# Patient Record
Sex: Male | Born: 1970
Health system: Southern US, Community
[De-identification: ages and names within clinical notes are randomized; demographics above are authoritative.]

## PROBLEM LIST (undated history)

## (undated) DIAGNOSIS — I10 Essential (primary) hypertension: Secondary | ICD-10-CM

## (undated) HISTORY — DX: Essential (primary) hypertension: I10

## (undated) HISTORY — PX: OTHER SURGICAL HISTORY: SHX169

---

## 2001-06-23 ENCOUNTER — Emergency Department (HOSPITAL_COMMUNITY): Admission: EM | Admit: 2001-06-23 | Discharge: 2001-06-23 | Payer: Self-pay

## 2001-09-18 ENCOUNTER — Encounter: Admission: RE | Admit: 2001-09-18 | Discharge: 2001-09-18 | Payer: Self-pay | Admitting: Family Medicine

## 2002-04-05 ENCOUNTER — Encounter: Admission: RE | Admit: 2002-04-05 | Discharge: 2002-04-05 | Payer: Self-pay | Admitting: Sports Medicine

## 2002-09-09 ENCOUNTER — Encounter: Admission: RE | Admit: 2002-09-09 | Discharge: 2002-09-09 | Payer: Self-pay | Admitting: Family Medicine

## 2002-09-20 ENCOUNTER — Encounter: Admission: RE | Admit: 2002-09-20 | Discharge: 2002-09-20 | Payer: Self-pay | Admitting: Sports Medicine

## 2002-12-31 ENCOUNTER — Encounter: Admission: RE | Admit: 2002-12-31 | Discharge: 2002-12-31 | Payer: Self-pay | Admitting: Family Medicine

## 2003-04-15 ENCOUNTER — Encounter: Admission: RE | Admit: 2003-04-15 | Discharge: 2003-04-15 | Payer: Self-pay | Admitting: Family Medicine

## 2003-10-22 ENCOUNTER — Encounter: Admission: RE | Admit: 2003-10-22 | Discharge: 2003-10-22 | Payer: Self-pay | Admitting: Family Medicine

## 2003-10-24 ENCOUNTER — Encounter: Admission: RE | Admit: 2003-10-24 | Discharge: 2003-10-24 | Payer: Self-pay | Admitting: Sports Medicine

## 2004-03-09 ENCOUNTER — Encounter: Admission: RE | Admit: 2004-03-09 | Discharge: 2004-03-09 | Payer: Self-pay | Admitting: Family Medicine

## 2004-07-21 ENCOUNTER — Ambulatory Visit: Payer: Self-pay | Admitting: Sports Medicine

## 2004-12-28 ENCOUNTER — Ambulatory Visit: Payer: Self-pay | Admitting: Sports Medicine

## 2005-11-29 ENCOUNTER — Ambulatory Visit: Payer: Self-pay | Admitting: Sports Medicine

## 2006-04-03 ENCOUNTER — Ambulatory Visit: Payer: Self-pay | Admitting: Family Medicine

## 2006-09-07 DIAGNOSIS — I10 Essential (primary) hypertension: Secondary | ICD-10-CM

## 2007-01-21 ENCOUNTER — Emergency Department (HOSPITAL_COMMUNITY): Admission: EM | Admit: 2007-01-21 | Discharge: 2007-01-21 | Payer: Self-pay | Admitting: Emergency Medicine

## 2007-01-22 ENCOUNTER — Ambulatory Visit: Payer: Self-pay | Admitting: Family Medicine

## 2007-01-22 ENCOUNTER — Telehealth: Payer: Self-pay | Admitting: *Deleted

## 2007-01-22 ENCOUNTER — Encounter (INDEPENDENT_AMBULATORY_CARE_PROVIDER_SITE_OTHER): Payer: Self-pay | Admitting: Family Medicine

## 2007-01-23 ENCOUNTER — Telehealth: Payer: Self-pay | Admitting: *Deleted

## 2007-01-24 ENCOUNTER — Telehealth: Payer: Self-pay | Admitting: *Deleted

## 2007-01-24 ENCOUNTER — Encounter (INDEPENDENT_AMBULATORY_CARE_PROVIDER_SITE_OTHER): Payer: Self-pay | Admitting: *Deleted

## 2007-11-02 ENCOUNTER — Encounter (INDEPENDENT_AMBULATORY_CARE_PROVIDER_SITE_OTHER): Payer: Self-pay | Admitting: Family Medicine

## 2007-11-02 ENCOUNTER — Ambulatory Visit: Payer: Self-pay | Admitting: Family Medicine

## 2007-11-02 ENCOUNTER — Telehealth: Payer: Self-pay | Admitting: *Deleted

## 2007-11-02 LAB — CONVERTED CEMR LAB
BUN: 16 mg/dL (ref 6–23)
Basophils Absolute: 0 10*3/uL (ref 0.0–0.1)
Calcium: 9.7 mg/dL (ref 8.4–10.5)
Chloride: 104 meq/L (ref 96–112)
Eosinophils Absolute: 0 10*3/uL (ref 0.0–0.7)
Hemoglobin: 14.4 g/dL (ref 13.0–17.0)
Lymphs Abs: 1.9 10*3/uL (ref 0.7–4.0)
Monocytes Relative: 9 % (ref 3–12)
Neutrophils Relative %: 64 % (ref 43–77)
Platelets: 322 10*3/uL (ref 150–400)
RBC: 4.89 M/uL (ref 4.22–5.81)

## 2007-11-05 ENCOUNTER — Encounter (INDEPENDENT_AMBULATORY_CARE_PROVIDER_SITE_OTHER): Payer: Self-pay | Admitting: Family Medicine

## 2008-02-08 ENCOUNTER — Telehealth: Payer: Self-pay | Admitting: Family Medicine

## 2008-03-07 ENCOUNTER — Telehealth: Payer: Self-pay | Admitting: Family Medicine

## 2008-05-08 IMAGING — CR DG KNEE COMPLETE 4+V*L*
4 series · 4 of 4 positions shown · non-contrast
Comparison: none

CLINICAL DATA: 36-year-old with knee pain since yesterday.  No known injury.  Prior ligament surgery.  
 LEFT KNEE ? 4 VIEW:

[t knee ap left]
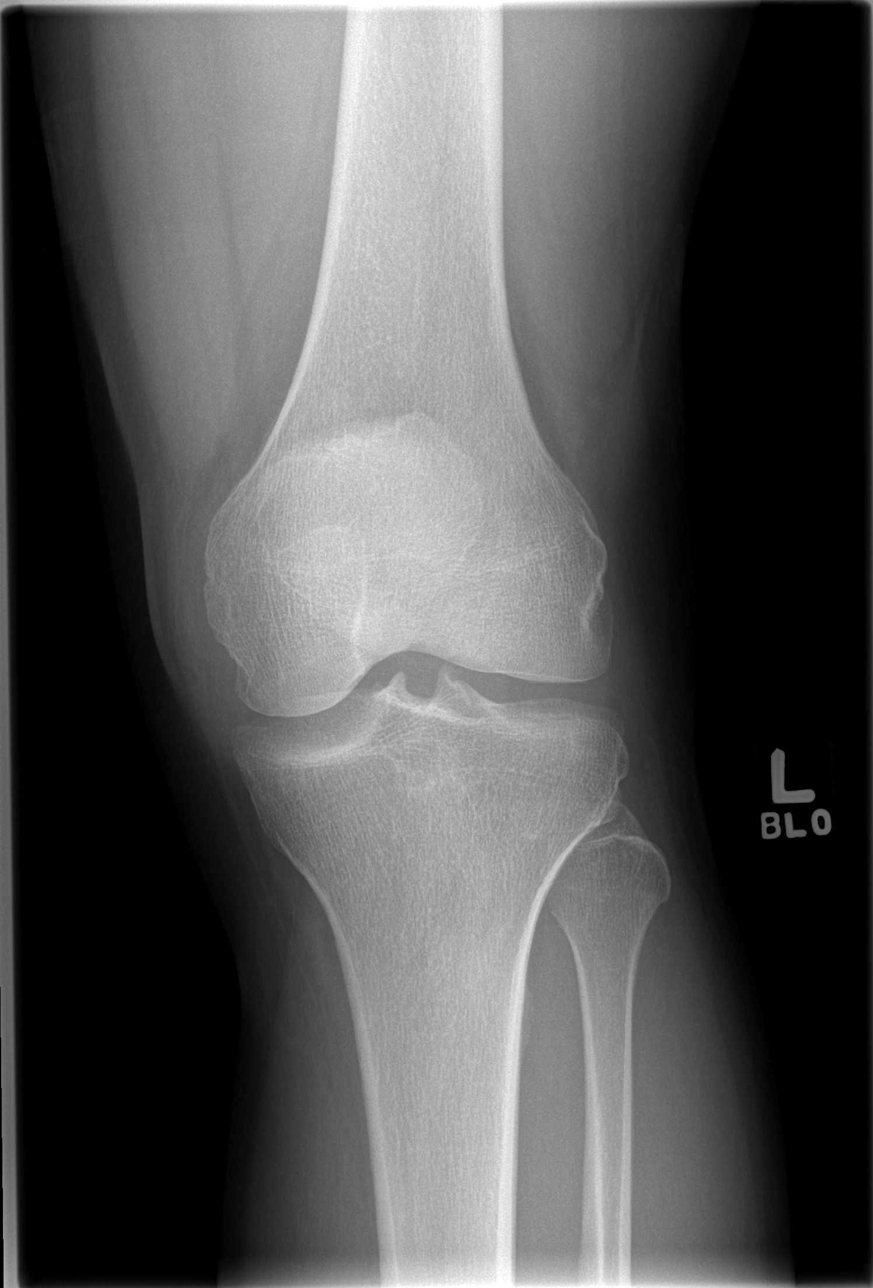

[t knee oblique left (1 of 2)]
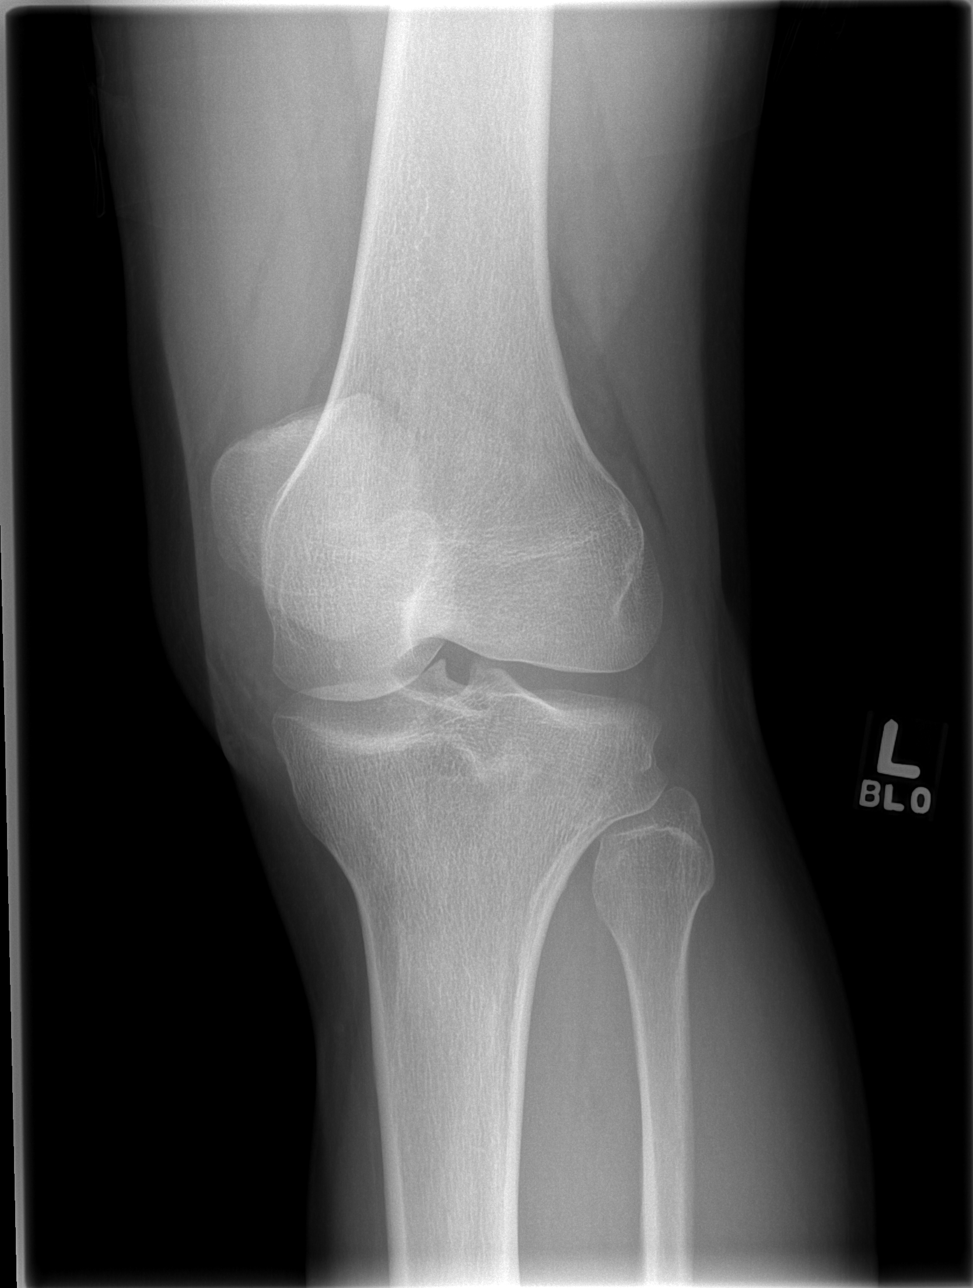

[t knee oblique left (2 of 2)]
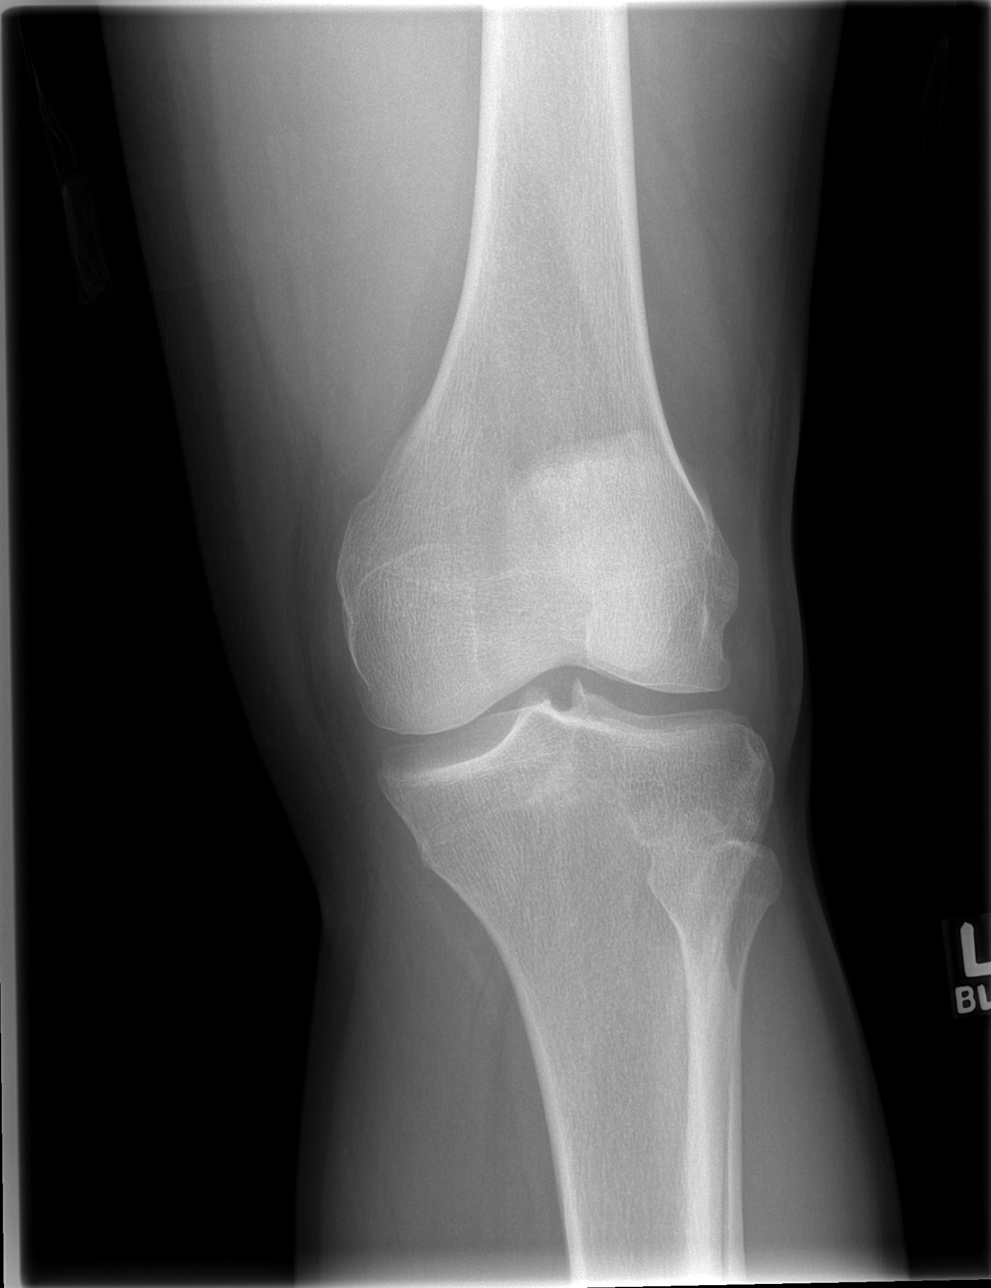

[t knee lat left]
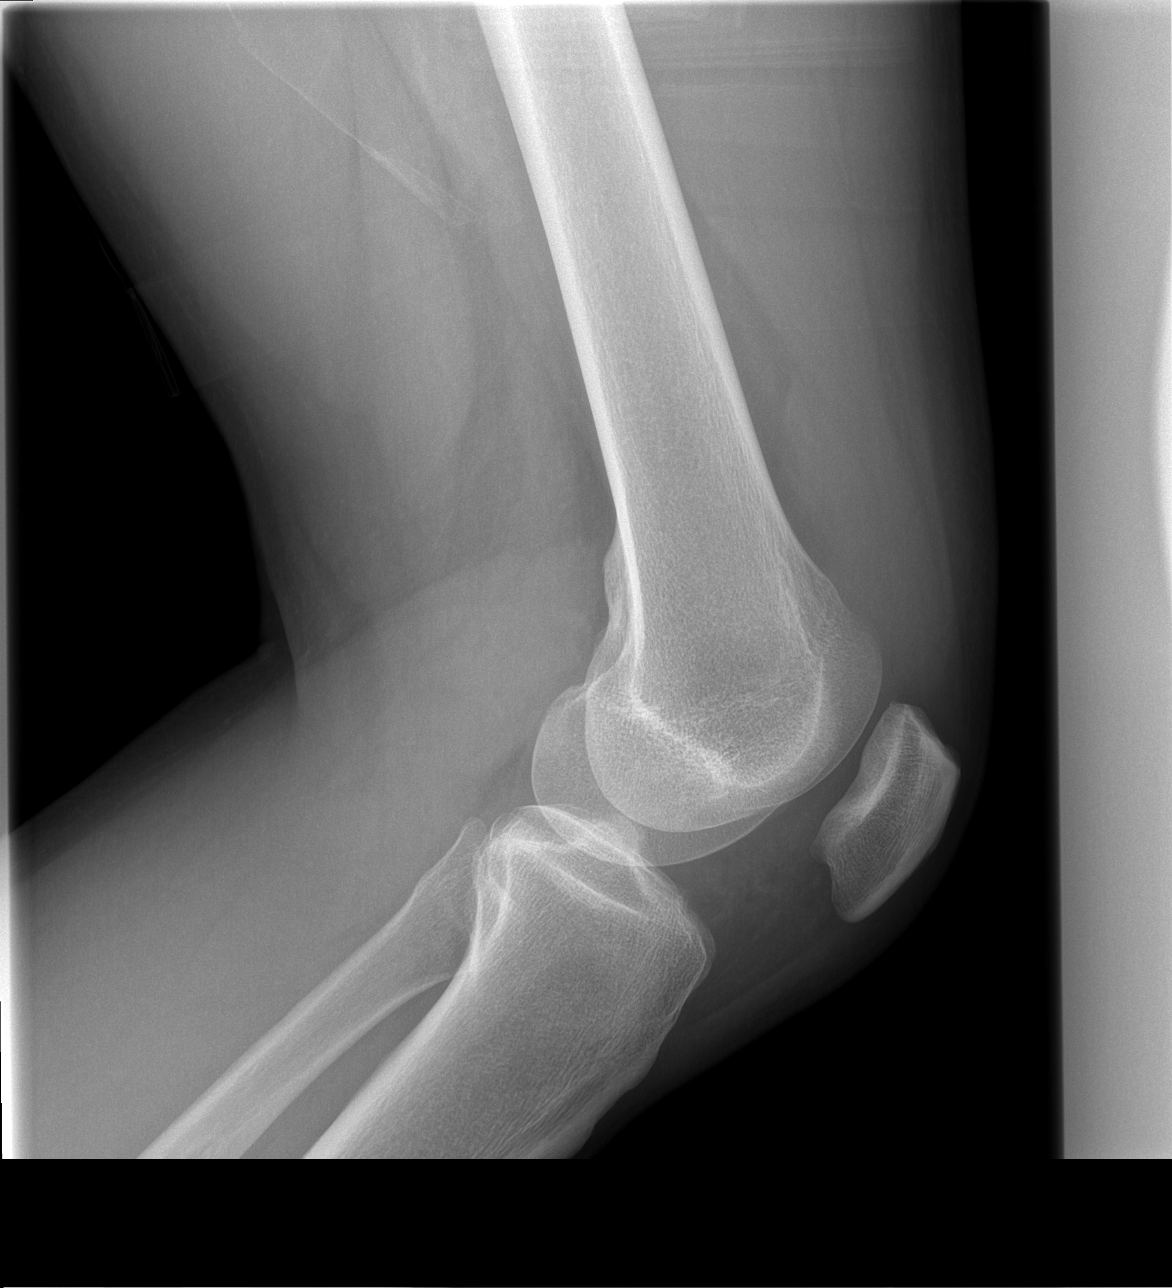

[4 of 4 positions shown; findings below may reference images not displayed]

FINDINGS: There is no evidence for acute fracture or dislocation.  Small joint effusion is suspected.  Consider MRI for further evaluation.
IMPRESSION: 1.  No fracture. 
 2.  Small effusion.

## 2008-05-21 ENCOUNTER — Telehealth: Payer: Self-pay | Admitting: *Deleted

## 2008-05-30 ENCOUNTER — Encounter: Admission: RE | Admit: 2008-05-30 | Discharge: 2008-05-30 | Payer: Self-pay | Admitting: Family Medicine

## 2008-05-30 ENCOUNTER — Ambulatory Visit: Payer: Self-pay | Admitting: Family Medicine

## 2008-05-30 LAB — CONVERTED CEMR LAB
Glucose, Urine, Semiquant: NEGATIVE
Nitrite: NEGATIVE
Specific Gravity, Urine: 1.02
Urobilinogen, UA: 0.2
WBC Urine, dipstick: NEGATIVE
pH: 7

## 2008-05-31 ENCOUNTER — Encounter: Payer: Self-pay | Admitting: Family Medicine

## 2009-04-02 ENCOUNTER — Ambulatory Visit: Payer: Self-pay | Admitting: Family Medicine

## 2009-04-02 ENCOUNTER — Encounter: Payer: Self-pay | Admitting: Family Medicine

## 2009-04-06 LAB — CONVERTED CEMR LAB
Chloride: 103 meq/L (ref 96–112)
Potassium: 4 meq/L (ref 3.5–5.3)
Sodium: 141 meq/L (ref 135–145)

## 2009-09-15 IMAGING — US US RENAL
1 series · 14 of 25 positions shown · non-contrast
Comparison: None

CLINICAL DATA: Microscopic hematuria

RENAL/URINARY TRACT ULTRASOUND
TECHNIQUE: Complete ultrasound examination of the urinary tract
was performed including evaluation of the kidneys, renal collecting
systems, and urinary bladder.

[Series 1: us renal · 0.28mm/px · 14 of 28 slices shown]
[im 1/28]
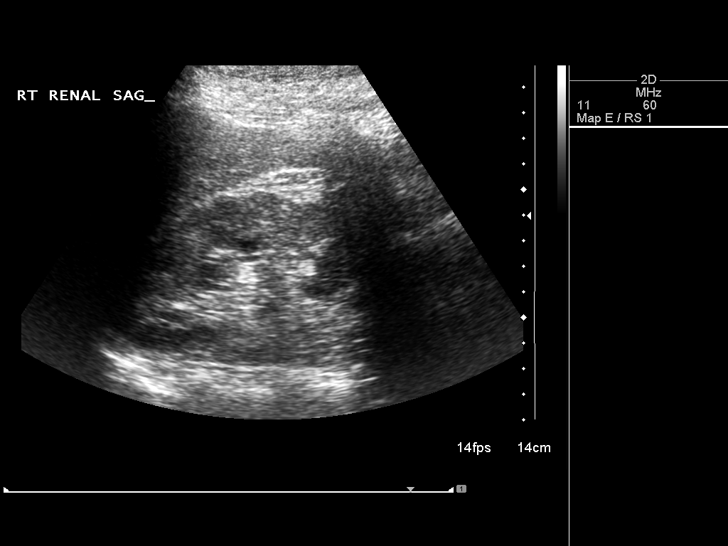
[im 3/28]
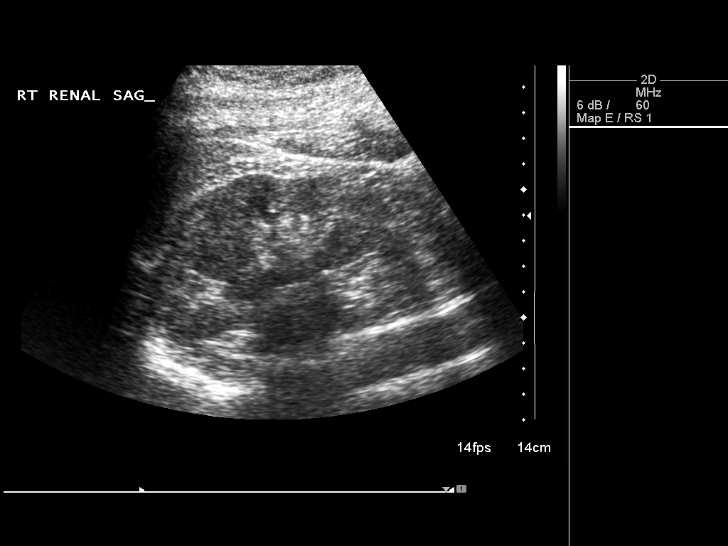
[im 5/28]
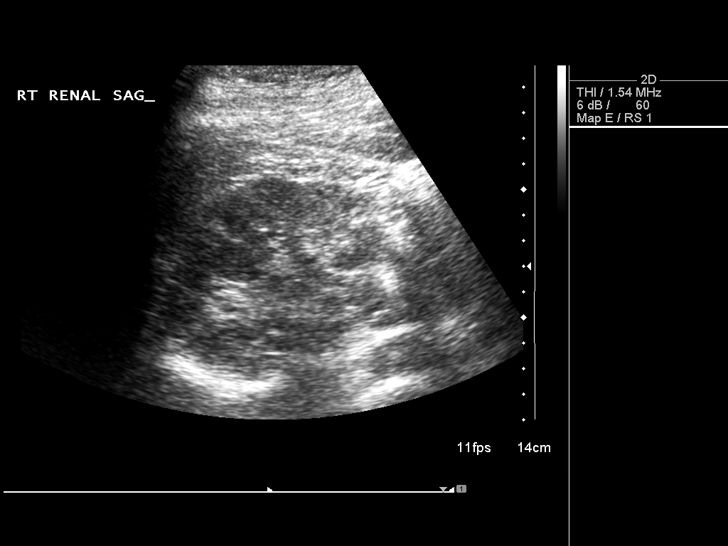
[im 7/28]
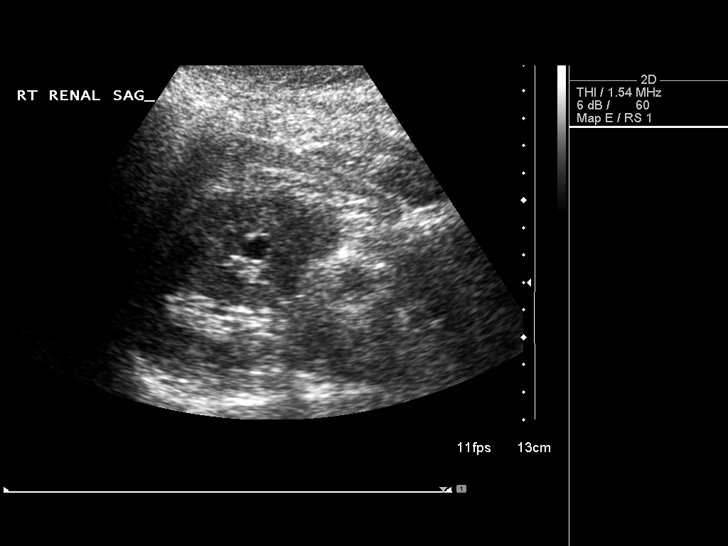
[im 10/28]
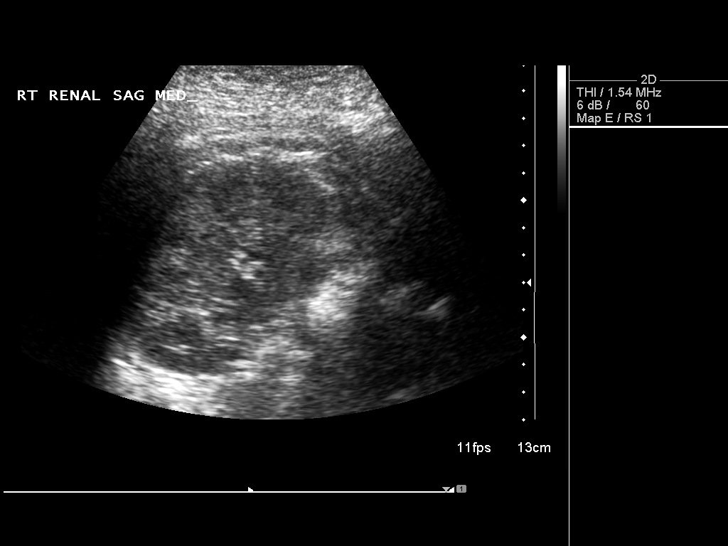
[im 11/28]
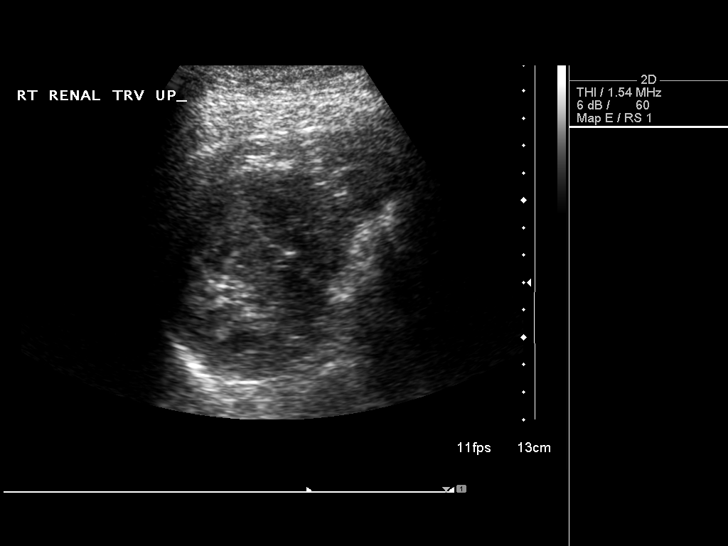
[im 13/28]
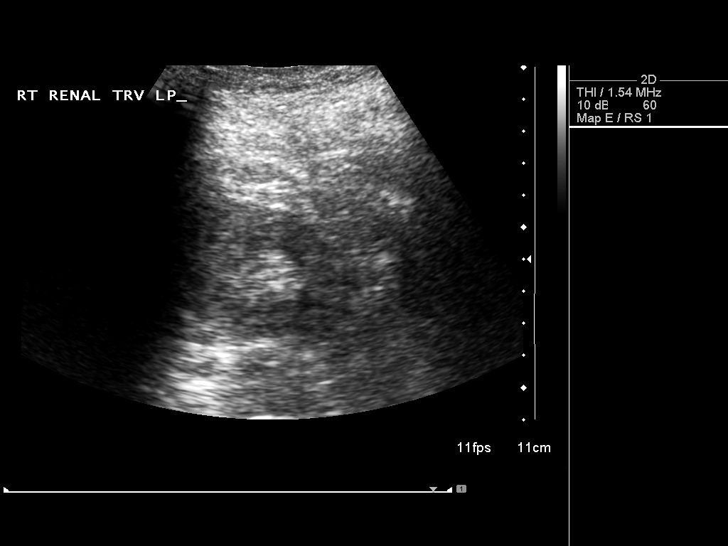
[im 15/28]
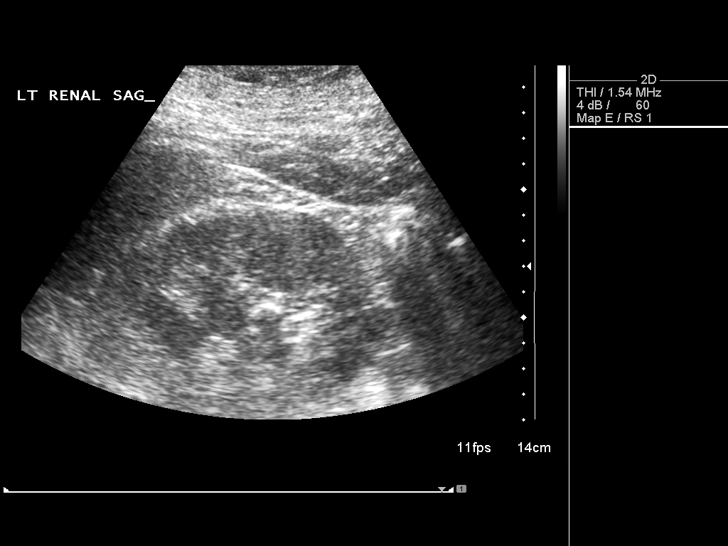
[im 17/28]
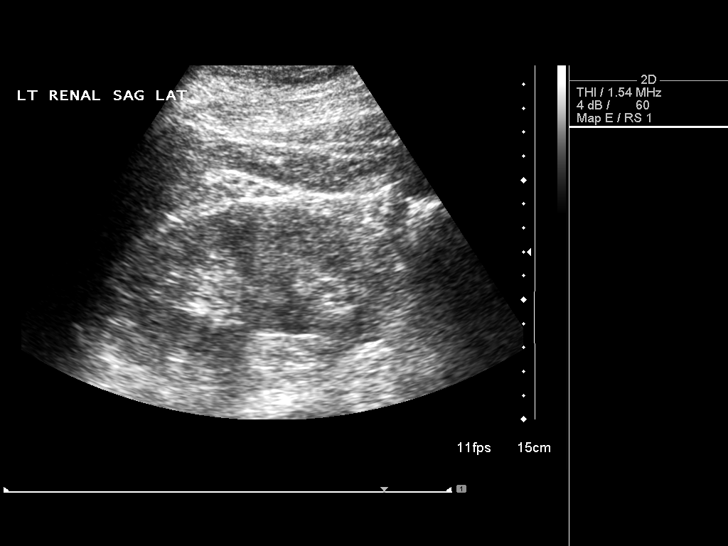
[im 19/28]
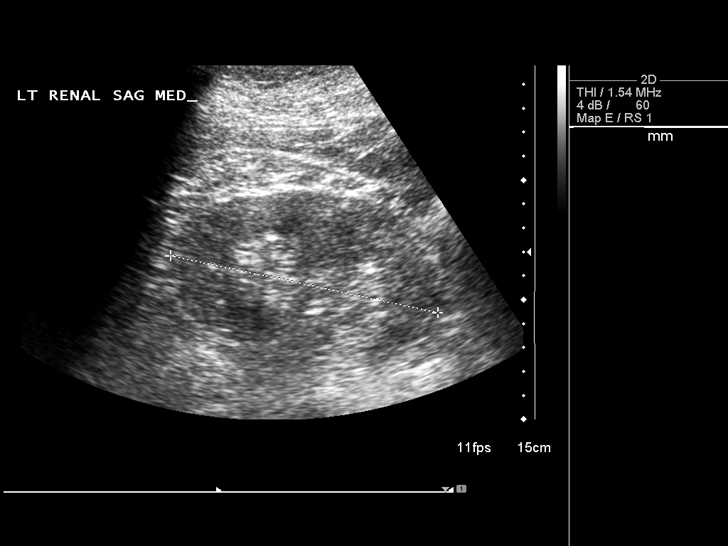
[im 21/28]
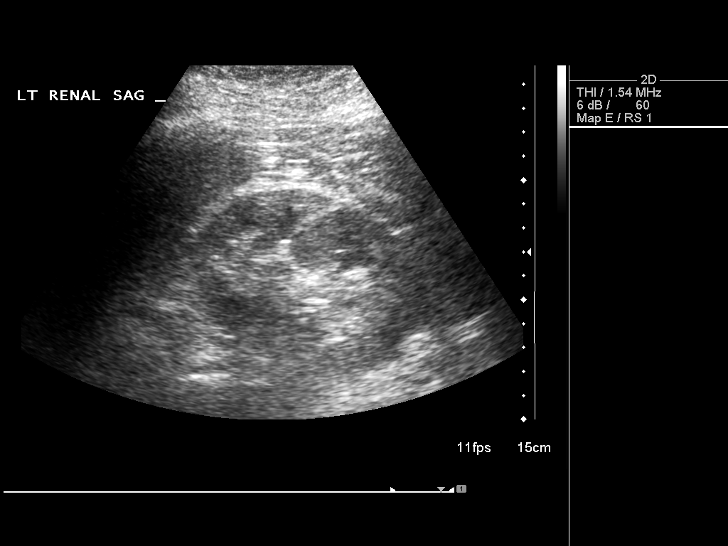
[im 23/28]
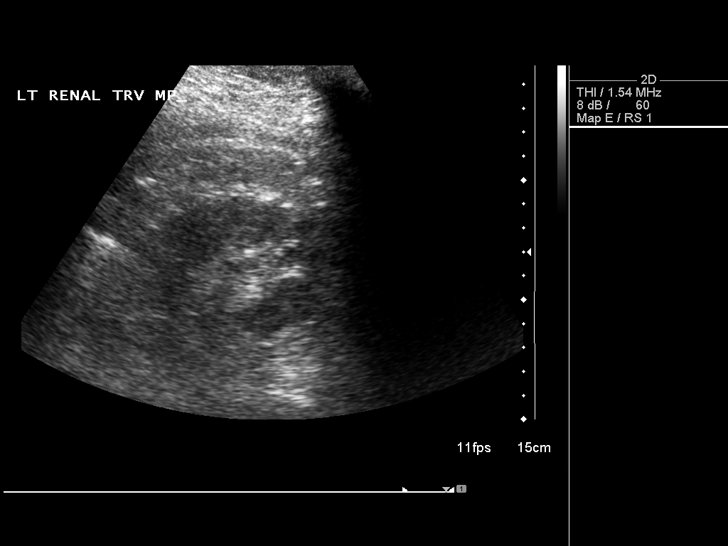
[im 25/28]
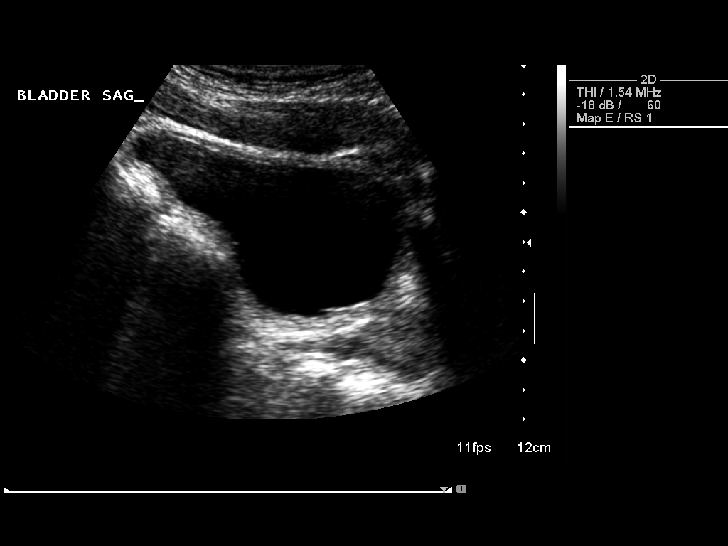
[im 28/28]
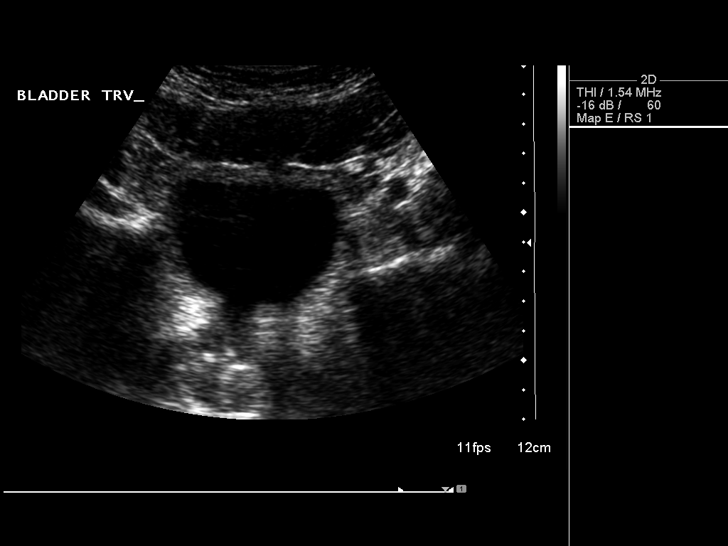

[14 of 25 positions shown; findings below may reference images not displayed]

FINDINGS: Right kidney:  10.3 cm.  Small interpolar cyst measuring 1.0 x
x 0.8 cm.  Renal cortex is mildly echogenic.  No evidence of
hydronephrosis, calculi or mass.

Left kidney:  11.4 cm.  Cortex is echogenic.  No obstruction or
renal mass.  No shadowing calculi.

Bladder:  Normal by ultrasound.
IMPRESSION: No renal masses or calculi evident by ultrasound.  Both kidneys
show some degree of increased cortical echogenicity, left greater
than right.  Correlation is suggested with renal function as this
may be indicative of chronic parenchymal disease.

## 2009-10-20 ENCOUNTER — Telehealth: Payer: Self-pay | Admitting: Family Medicine

## 2009-10-26 ENCOUNTER — Encounter: Payer: Self-pay | Admitting: Family Medicine

## 2009-11-16 ENCOUNTER — Encounter (INDEPENDENT_AMBULATORY_CARE_PROVIDER_SITE_OTHER): Payer: Self-pay | Admitting: Family Medicine

## 2009-11-17 ENCOUNTER — Encounter: Payer: Self-pay | Admitting: *Deleted

## 2009-11-18 ENCOUNTER — Encounter: Payer: Self-pay | Admitting: *Deleted

## 2009-12-02 ENCOUNTER — Ambulatory Visit: Payer: Self-pay | Admitting: Family Medicine

## 2009-12-02 LAB — CONVERTED CEMR LAB
Chloride: 103 meq/L (ref 96–112)
Glucose, Bld: 97 mg/dL (ref 70–99)
Sodium: 137 meq/L (ref 135–145)

## 2009-12-03 ENCOUNTER — Encounter: Payer: Self-pay | Admitting: Family Medicine

## 2009-12-09 ENCOUNTER — Telehealth: Payer: Self-pay | Admitting: *Deleted

## 2010-02-09 ENCOUNTER — Encounter: Payer: Self-pay | Admitting: Family Medicine

## 2010-04-09 ENCOUNTER — Telehealth: Payer: Self-pay | Admitting: Family Medicine

## 2010-08-12 NOTE — Progress Notes (Signed)
Summary: refill   Phone Note Refill Request Call back at Home Phone [phone] Message from:  Patient  Refills Requested: Medication #1:  AMLODIPINE BESYLATE 5 MG  TABS 1 by mouth once daily   Supply Requested: 3 months  Medication #2:  HYZAAR 50-12.5 MG  TABS 1 by mouth once daily.   Supply Requested: 3 months Initial call taken by: De Nurse,  April 09, 2010 9:57 AM    Prescriptions: Mauri Reading 50-12.5 MG  TABS (LOSARTAN POTASSIUM-HCTZ) 1 by mouth once daily  #90 x 3   Entered and Authorized by:   Denny Levy MD   Signed by:   Denny Levy MD on 04/09/2010   Method used:   [facility]        [facility]  [address] [phone]* (retail)       [address]       [place], [place]  [id number]       Ph: [phone] or [phone]       Fax: [phone]   RxID:   [id number] AMLODIPINE BESYLATE 5 MG  TABS (AMLODIPINE BESYLATE) 1 by mouth once daily  #90 x 3   Entered and Authorized by:   Denny Levy MD   Signed by:   Denny Levy MD on 04/09/2010   Method used:   [facility]ically to        [facility]  [address] [phone]* (retail)       [address]       [place], [place]  [id number]       Ph: [phone] or [phone]       Fax: [phone]   RxID:   [id number]

## 2010-08-12 NOTE — Miscellaneous (Signed)
Summary: refill request   Clinical Lists Changes rec'd refill request. has not been here since 9/10. I had called him & made him an appt. He did not keep that appt. to pcp.Golden Circle RN  Nov 16, 2009 8:51 AM  triage he needs to come for appt Thanks!  Denny Levy MD  Nov 16, 2009 4:46 PM  called & LM to call back & make appt for today.Golden Circle RN  Nov 17, 2009 9:26 AM  needs Hyzaar filled until appt on 5/25 - [facility] for 90 day supply (for insuance purposes) has appt w/ Jennette Kettle on 5/25 - pls call to let pt know when done - [phone] De Nurse  Nov 17, 2009 9:39 AM  to pcp.Golden Circle RN  Nov 17, 2009 9:58 AM  Medications: Rx of HYZAAR 50-12.5 MG  TABS (LOSARTAN POTASSIUM-HCTZ) 1 by mouth once daily;  #90 x 3;  Signed;  Entered by: Denny Levy MD;  Authorized by: Denny Levy MD;  Method used: Print then Give to Patient Rx of HYZAAR 50-12.5 MG  TABS (LOSARTAN POTASSIUM-HCTZ) 1 by mouth once daily;  #30 x 1;  Signed;  Entered by: Denny Levy MD;  Authorized by: Denny Levy MD;  Method used: Print then Give to Patient    DEAR Paul Henderson TEAM I am putting rx for one month supply on your desk Thanks!  Denny Levy MD  Nov 17, 2009 2:29 PM  Prescriptions: HYZAAR 50-12.5 MG  TABS (LOSARTAN POTASSIUM-HCTZ) 1 by mouth once daily  #30 x 1   Entered and Authorized by:   Denny Levy MD   Signed by:   Denny Levy MD on 11/17/2009   Method used:   Print then Give to Patient   RxID:   [id number] HYZAAR 50-12.5 MG  TABS (LOSARTAN POTASSIUM-HCTZ) 1 by mouth once daily  #90 x 3   Entered and Authorized by:   Denny Levy MD   Signed by:   Denny Levy MD on 11/17/2009   Method used:   Print then Give to Patient   RxID:   [id number]  Pt notified Rx at front for pick up.Gladstone Pih  Nov 17, 2009 2:45 PM

## 2010-08-12 NOTE — Miscellaneous (Signed)
Summary: ROI  ROI   Imported By: Bradly Bienenstock 02/09/2010 16:45:56  _____________________________________________________________________  External Attachment:    Type:   Image     Comment:   External Document

## 2010-08-12 NOTE — Miscellaneous (Signed)
Summary: amlodipine approved   [facility] Lists Changes rec'd [facility] from [facility] that amlopdipine has been approved for 1 yr.Golden Circle RN  Nov 18, 2009 10:27 AM'

## 2010-08-12 NOTE — Progress Notes (Signed)
Summary: Rx Req   Phone Note Refill Request Call back at Home Phone [phone] Message from:  Patient  Refills Requested: Medication #1:  AMLODIPINE BESYLATE 5 MG  TABS 1 by mouth once daily  Medication #2:  HYZAAR 50-12.5 MG  TABS 1 by mouth once daily. [facility] [address]  Initial call taken by: Clydell Hakim,  October 20, 2009 9:08 AM  Follow-up for Phone Call        to pcp Follow-up by: Theresia Lo RN,  October 20, 2009 9:39 AM    Prescriptions: AMLODIPINE BESYLATE 5 MG  TABS (AMLODIPINE BESYLATE) 1 by mouth once daily  #90 x 3   Entered and Authorized by:   Denny Levy MD   Signed by:   Denny Levy MD on 10/20/2009   Method used:   Electronically to        [facility]  [address] [phone]* (retail)       [address]       [place], [place]  [id number]       Ph: [phone] or [phone]       Fax: [phone]   RxID:   [id number]

## 2010-08-12 NOTE — Miscellaneous (Signed)
Summary: med fill   [facility] about [facility] meds. told him md was authorizing #30 on each (they never got the amlodipine that was filled last month) they are going to label must keep app for further refills.Golden Circle RN  Nov 17, 2009 3:36 PM

## 2010-08-12 NOTE — Letter (Signed)
Summary: LAB Letter  [facility]  [address]   [place], [place] [address]   Phone: [phone]  Fax: [phone]    12/03/2009  ANDRZEJ SCULLY [address] [place], [place]  [address]  Dear Mr. MEHRER,   Your LDL cholesterol is perfect at 99. The goal is less than 100 for you. All of the  other labs including blood sugar, kidney function and electrolytes were normal as well. Great to see yoU!       Sincerely,   Denny Levy MD  Appended Document: LAB Letter mailed.

## 2010-08-12 NOTE — Progress Notes (Signed)
Summary: RX   Phone Note Refill Request Call back at [facility] Phone [phone]   Refills Requested: Medication #1:  AMLODIPINE BESYLATE 5 MG  TABS 1 by mouth once daily  Medication #2:  HYZAAR 50-12.5 MG  TABS 1 by mouth once daily. [facility], fax to [phone]  Initial call taken by: Knox Royalty,  December 09, 2009 2:48 PM  Follow-up for Phone Call        to PCP Follow-up by: Gladstone Pih,  December 09, 2009 3:31 PM    Prescriptions: HYZAAR 50-12.5 MG  TABS (LOSARTAN POTASSIUM-HCTZ) 1 by mouth once daily  #90 x 3   Entered and Authorized by:   Denny Levy MD   Signed by:   Denny Levy MD on 12/10/2009   Method used:   Print then Give to Patient   RxID:   [id number] AMLODIPINE BESYLATE 5 MG  TABS (AMLODIPINE BESYLATE) 1 by mouth once daily  #90 x 3   Entered and Authorized by:   Denny Levy MD   Signed by:   Denny Levy MD on 12/10/2009   Method used:   Print then Give to Patient   RxID:   [id number]   DEAR WHITE TEAM I am putting these on your desk can you attach to an [facility] page and fax Thanks!  Denny Levy MD  December 10, 2009 9:52 AM   rx faxed pt informed .Marland KitchenLoralee Pacas CMA  December 10, 2009 4:22 PM

## 2010-08-12 NOTE — Assessment & Plan Note (Signed)
Summary: f/up bp,tcb    Vital Signs:  Patient profile:   40 year old male Height:      67 inches Weight:      225 pounds BMI:     35.37 Temp:     98.0 degrees F oral Pulse rate:   96 / minute BP sitting:   112 / 77  (left arm) Cuff size:   regular  Vitals Entered By: Tessie Fass CMA (Dec 02, 2009 11:16 AM) CC: F/U BP and meds Is Patient Diabetic? No Pain Assessment Patient in pain? no        Primary Care Provider:  Denny Levy MD  CC:  F/U BP and meds.  History of Present Illness: Follow up hypertension. Taking medicines regularly with no problems. Not having any any headaches or chest pains.  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   Habits & Providers  Alcohol-Tobacco-Diet     Tobacco Status: never  Current Medications (verified): 1)  Amlodipine Besylate 5 Mg  Tabs (Amlodipine Besylate) .Marland Kitchen.. 1 By Mouth Once Daily 2)  Hyzaar 50-12.5 Mg  Tabs (Losartan Potassium-Hctz) .Marland Kitchen.. 1 By Mouth Once Daily  Allergies: No Known Drug Allergies  Social History: Smoking Status:  never  Review of Systems  The patient denies fever, weight loss, weight gain, chest pain, syncope, dyspnea on exertion, peripheral edema, and headaches.    Physical Exam  General:  alert.   Neck:  supple, full ROM, no masses, and no thyromegaly.   Lungs:  normal respiratory effort, normal breath sounds, and no wheezes.   Heart:  normal rate, regular rhythm, and no murmur.   Abdomen:  soft.   Extremities:  no edema   Impression & Recommendations:  Problem # 1:  HYPERTENSION, BENIGN SYSTEMIC (ICD-401.1) Orders: Direct LDL-[facility] ([id number]) Basic Met-[facility] ([id number]) [facility]- Est Level  3 (99213)contine current meds.rtc 1 y   Complete Medication List: 1)  Amlodipine Besylate 5 Mg Tabs (Amlodipine besylate) .Marland Kitchen.. 1 by mouth once daily 2)  Hyzaar 50-12.5 Mg Tabs  (Losartan potassium-hctz) .Marland Kitchen.. 1 by mouth once daily Direct LDL-[facility] ([id number]) Basic Met-[facility] [id number]) [facility]- Est Level  3 (28413) Prescriptions: HYZAAR 50-12.5 MG  TABS (LOSARTAN POTASSIUM-HCTZ) 1 by mouth once daily  #30 x 1   Entered and Authorized by:   Denny Levy MD   Signed by:   Denny Levy MD on 12/02/2009   Method used:   Print then Give to Patient   RxID:   [id number] AMLODIPINE BESYLATE 5 MG  TABS (AMLODIPINE BESYLATE) 1 by mouth once daily  #90 x 3   Entered and Authorized by:   Denny Levy MD   Signed by:   Denny Levy MD on 12/02/2009   Method used:   Print then Give to Patient   RxID:   [id number]    Impression & Recommendations:  His updated medication list for this problem includes:    Amlodipine Besylate 5 Mg Tabs (Amlodipine besylate) .Marland Kitchen... 1 by mouth once daily    Hyzaar 50-12.5 Mg Tabs (Losartan potassium-hctz) .Marland Kitchen... 1 by mouth once daily  Orders: Direct LDL-[facility] [phone]) Basic Met-[facility] [phone])   Orders: Direct LDL-[facility] ([id number]) Basic Met-[facility] ([id number]) [facility]- Est Level  3 ([address]283)    Complete Medication List: 1)  Amlodipine Besylate 5 Mg Tabs (Amlodipine besylate) .Marland Kitchen.. 1 by mouth once daily 2)  Hyzaar 50-12.5 Mg Tabs (Losartan potassium-hctz) .Marland KitchenMarland KitchenMarland Kitchen 1  by mouth once daily   Prevention & Chronic Care Immunizations   Influenza vaccine: Not documented    Tetanus booster: Not documented    Pneumococcal vaccine: Not documented  Other Screening   Smoking status: never  (12/02/2009)  Lipids   Total Cholesterol: Not documented   LDL: Not documented   LDL Direct: Not documented   HDL: Not documented   Triglycerides: Not documented  Hypertension   Last Blood Pressure: 112 / 77  (12/02/2009)   Serum creatinine: 1.09  (04/02/2009)   BMP action: Ordered   Serum potassium 4.0  (04/02/2009)    Hypertension flowsheet reviewed?: Yes   Progress toward BP goal: At goal  Self-Management Support :   Personal Goals (by the  next clinic visit) :      Personal blood pressure goal: 140/90  (04/02/2009)   Hypertension self-management support: BP self-monitoring log, Written self-care plan, Education handout  (04/02/2009)    Hypertension self-management support not done because: Good outcomes  (04/02/2009)

## 2010-08-12 NOTE — Miscellaneous (Signed)
Summary: losartan approved   [facility] the losartin thru 11/23/09.Golden Circle RN  October 26, 2009 12:43 PM

## 2010-08-19 ENCOUNTER — Encounter: Payer: Self-pay | Admitting: *Deleted

## 2010-10-20 ENCOUNTER — Ambulatory Visit: Payer: Self-pay | Admitting: Family Medicine

## 2011-04-28 ENCOUNTER — Other Ambulatory Visit: Payer: Self-pay | Admitting: Family Medicine

## 2011-04-28 ENCOUNTER — Telehealth: Payer: Self-pay | Admitting: Family Medicine

## 2011-04-28 ENCOUNTER — Encounter: Payer: Self-pay | Admitting: Family Medicine

## 2011-04-28 MED ORDER — AMLODIPINE BESYLATE 5 MG PO TABS: 5.0000 mg | ORAL_TABLET | Freq: Every day | ORAL | Status: DC

## 2011-04-28 MED ORDER — LOSARTAN POTASSIUM-HCTZ 50-12.5 MG PO TABS: 1.0000 | ORAL_TABLET | Freq: Every day | ORAL | Status: DC

## 2011-04-28 NOTE — Telephone Encounter (Signed)
Spoke with pt and informed him that since he has not been to see Dr. Jennette Kettle in over 1 yr that he will need to be seen, and have labs drawn due to the type of medication that he is taking. I asked pt if he can do some type of payment arrangements? At least pay $40? He stated that he cannot do that at this time because he has other obligations and cannot afford to do this. I told pt that I understood however, it is imperative for him to be seen and we cannot continue to supply these medications to him. I expressed my empathy for him and told him that this is a business and our business is taking care of our patients and this would be ethically immoral and should something happen to him due to the fact that he has not been seen and proper lab work obtained it would be on our hands because we did not give proper patient care.  I told him that I would speak with Dr. Jennette Kettle concerning this however I could not guarantee that she will prescribe these medications to him with out being seen. Pt understood and agreed.Laureen Ochs, [name]

## 2011-04-28 NOTE — Telephone Encounter (Signed)
lvm to confirm the medications that the pt is requesting refills for.Paul Henderson, [name]

## 2011-04-28 NOTE — Telephone Encounter (Signed)
Spoke with Dr. Jennette Kettle and she is going to write a letter to the pt with recommendations for him. Called pt and informed him as to what Dr. Jennette Kettle is willing to do,  I will place this up front for pt to p/u.Marland KitchenLoralee Pacas [place]

## 2011-04-28 NOTE — Telephone Encounter (Signed)
Pt is out of both of his meds and since he had changed jobs, he has not been able to afford to come in for OV.  He is getting his insurance back in 2 months and wants to know if he can have these meds called until he can get an appt.    pls let patient know today  [facility] [address]

## 2011-09-07 ENCOUNTER — Other Ambulatory Visit: Payer: Self-pay | Admitting: Family Medicine

## 2011-09-07 DIAGNOSIS — I10 Essential (primary) hypertension: Secondary | ICD-10-CM

## 2011-09-08 NOTE — Telephone Encounter (Signed)
Refill request-Dr. Neal at [facility] 

## 2011-09-08 NOTE — Telephone Encounter (Signed)
Appointment scheduled for 03/13 with Dr. Louanne Belton.  Patient has not been seen in this office since 11/2009. Advised can give 2 week supply and will need office visit.  Patient voices understanding.

## 2011-09-21 ENCOUNTER — Ambulatory Visit: Payer: Self-pay | Admitting: Family Medicine

## 2011-09-26 ENCOUNTER — Ambulatory Visit (INDEPENDENT_AMBULATORY_CARE_PROVIDER_SITE_OTHER): Payer: BC Managed Care – PPO | Admitting: Family Medicine

## 2011-09-26 ENCOUNTER — Encounter: Payer: Self-pay | Admitting: Family Medicine

## 2011-09-26 VITALS — BP 123/82 | HR 89 | Temp 98.2°F | Ht 70.0 in | Wt 232.5 lb

## 2011-09-26 DIAGNOSIS — I10 Essential (primary) hypertension: Secondary | ICD-10-CM

## 2011-09-26 LAB — BASIC METABOLIC PANEL
Chloride: 102 mEq/L (ref 96–112)
Glucose, Bld: 97 mg/dL (ref 70–99)
Potassium: 4.4 mEq/L (ref 3.5–5.3)

## 2011-09-26 MED ORDER — AMLODIPINE BESYLATE 5 MG PO TABS: 5.0000 mg | ORAL_TABLET | Freq: Every day | ORAL | Status: DC

## 2011-09-26 MED ORDER — LOSARTAN POTASSIUM-HCTZ 50-12.5 MG PO TABS: 1.0000 | ORAL_TABLET | Freq: Every day | ORAL | Status: DC

## 2011-09-26 NOTE — Assessment & Plan Note (Signed)
Well controlled. No current side effects from medications. I will check creatinine and potassium today. Otherwise will give the patient one years worth of refills.

## 2011-09-26 NOTE — Patient Instructions (Signed)
It was great to see you today! We will get one blood test today. I will call you if there is anything concerning. Otherwise I will send you a letter telling me that everything is okay. I am giving you one year of refills on her blood pressure medicines. If you have any concerns please come back to see [place] sooner.

## 2011-09-26 NOTE — Progress Notes (Signed)
Subjective: The patient is a 41 y.o. year old male who presents today for bp f/u.  1) HTN: reports bp in the 130s/80s.  No cp, SOB/DOE.  No LE edema. No other concerns regarding blood pressure.  Patient does report that he has occasional problems obtaining erections. This been going on intermittently for the last year. It seems to be worse with stress or lack of sleep. He first noticed it during a time of a high stress as he was changing jobs. It is not causing problems in his marital relationship and is not overly worried about it at this time. It happens very infrequently currently.  Tobacco, alcohol, and drug use reviewed. Past medical history reviewed and updated as appropriate.  Objective:  Filed Vitals:   09/26/11 0903  BP: 123/82  Pulse: 89  Temp: 98.2 F (36.8 C)   Gen: No acute distress, overweight CV: Regular rate and rhythm, no murmurs appreciated Resp: Clear to auscultation bilaterally Ext: 2+ pulses, no edema, good muscle strength, reflexes symmetric bilaterally  Assessment/Plan: Discussed causes of erectile dysfunction with the patient. His problems appear to be significantly more related to stress and lack of sleep. We will keep an eye on this, but currently I do not believe any intervention is necessary.  Please also see individual problems in problem list for problem-specific plans.

## 2011-09-29 ENCOUNTER — Encounter: Payer: Self-pay | Admitting: Family Medicine

## 2012-10-12 ENCOUNTER — Other Ambulatory Visit: Payer: Self-pay | Admitting: Family Medicine

## 2012-10-12 NOTE — Telephone Encounter (Signed)
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

## 2012-10-12 NOTE — Telephone Encounter (Signed)
Patient was seen on 3/18 and at that time 2 prescriptions with 11 refills were sent to his pharmacy, but they did not receive them.  They were for Amlodipine and Losartin/Hydrochlorothiazide.  The pharmacy never received them.  The patient still had some meds left, so he didn't know until now, that they didn't receive those prescriptions.  He needs them resent and since he doesn't have enough to last through the weekend, he would appreciate them being sent today.  Thank you.

## 2012-10-12 NOTE — Telephone Encounter (Signed)
Pt needing a doctors appt before any new refill,last ov 09/2011.thank you Ewin Rehberg, Virgel Bouquet

## 2012-10-12 NOTE — Telephone Encounter (Signed)
Pt needing a follow

## 2012-10-15 ENCOUNTER — Encounter: Payer: Self-pay | Admitting: Family Medicine

## 2012-10-15 ENCOUNTER — Ambulatory Visit (INDEPENDENT_AMBULATORY_CARE_PROVIDER_SITE_OTHER): Payer: BC Managed Care – PPO | Admitting: Family Medicine

## 2012-10-15 VITALS — BP 110/73 | HR 88 | Temp 98.8°F | Ht 70.0 in | Wt 224.0 lb

## 2012-10-15 DIAGNOSIS — I10 Essential (primary) hypertension: Secondary | ICD-10-CM

## 2012-10-15 LAB — COMPREHENSIVE METABOLIC PANEL
ALT: 19 U/L (ref 0–53)
AST: 18 U/L (ref 0–37)
Albumin: 4.2 g/dL (ref 3.5–5.2)
Alkaline Phosphatase: 66 U/L (ref 39–117)
BUN: 15 mg/dL (ref 6–23)
CO2: 27 mEq/L (ref 19–32)
Calcium: 10 mg/dL (ref 8.4–10.5)
Chloride: 102 mEq/L (ref 96–112)
Glucose, Bld: 88 mg/dL (ref 70–99)
Total Bilirubin: 0.8 mg/dL (ref 0.3–1.2)

## 2012-10-15 LAB — CBC
HCT: 40.5 % (ref 39.0–52.0)
RBC: 4.5 MIL/uL (ref 4.22–5.81)
WBC: 5.4 10*3/uL (ref 4.0–10.5)

## 2012-10-15 NOTE — Progress Notes (Signed)
  Subjective:    Patient ID: Paul Henderson, male    DOB: May 27, 1971, 42 y.o.   MRN: [id number]  HPI  1. HTN f/u. The patient was told to come in for checkup in order to get refills. He was last seen for hypertension slightly over one year ago. He reports no problems with medication. He still takes Norvasc and Hyzaar. He takes his blood pressure measurement at home and normally values are less than 120/80.  He has no tobacco or alcohol use reported. No family history of hypertension.  Review of Systems He denies any chest pain, edema, dyspnea, abdominal pain, urinary changes, rash.    Objective:   Physical Exam  Vitals reviewed. Constitutional: He is oriented to person, place, and time. He appears well-developed and well-nourished. No distress.  HENT:  Head: Normocephalic and atraumatic.  Mouth/Throat: Oropharynx is clear and moist.  Eyes: Pupils are equal, round, and reactive to light.  Neck: No thyromegaly present.  Cardiovascular: Normal rate, regular rhythm and normal heart sounds.   No murmur heard. Pulmonary/Chest: Effort normal and breath sounds normal. No respiratory distress. He has no wheezes. He has no rales.  Musculoskeletal: He exhibits no edema and no tenderness.  Neurological: He is alert and oriented to person, place, and time.  Skin: He is not diaphoretic.  Psychiatric: He has a normal mood and affect.       Assessment & Plan:

## 2012-10-15 NOTE — Assessment & Plan Note (Signed)
At goal today. Medications appears to [facility]. Will check fasting labs today including glucose, LFTs, creatinine. Patient will followup in 6-12 months with PCP or sooner if problems arise.

## 2012-10-15 NOTE — Patient Instructions (Addendum)
Sorry for the confusion. Please have your fasting bloodwork drawn. Will send a letter or call if abnormal. Continue exercise and healthy lifestyle to prevent hypertension complications. Make an appointment with Dr. Jennette Kettle in next 6-12 months for follow up, or sooner if needed.

## 2012-10-16 ENCOUNTER — Encounter: Payer: Self-pay | Admitting: Family Medicine

## 2013-06-19 ENCOUNTER — Encounter: Payer: Self-pay | Admitting: Family Medicine

## 2013-06-19 ENCOUNTER — Ambulatory Visit (INDEPENDENT_AMBULATORY_CARE_PROVIDER_SITE_OTHER): Payer: Commercial Managed Care - PPO | Admitting: Family Medicine

## 2013-06-19 VITALS — BP 125/77 | HR 94 | Temp 98.8°F | Ht 70.0 in | Wt 225.0 lb

## 2013-06-19 DIAGNOSIS — Z23 Encounter for immunization: Secondary | ICD-10-CM

## 2013-06-19 DIAGNOSIS — I10 Essential (primary) hypertension: Secondary | ICD-10-CM

## 2013-06-19 DIAGNOSIS — Z0000 Encounter for general adult medical examination without abnormal findings: Secondary | ICD-10-CM

## 2013-06-19 LAB — COMPREHENSIVE METABOLIC PANEL
Albumin: 4.4 g/dL (ref 3.5–5.2)
Alkaline Phosphatase: 68 U/L (ref 39–117)
CO2: 28 mEq/L (ref 19–32)
Calcium: 10.1 mg/dL (ref 8.4–10.5)
Creat: 1.02 mg/dL (ref 0.50–1.35)
Glucose, Bld: 86 mg/dL (ref 70–99)
Sodium: 138 mEq/L (ref 135–145)
Total Bilirubin: 0.8 mg/dL (ref 0.3–1.2)

## 2013-06-19 LAB — LIPID PANEL: LDL Cholesterol: 96 mg/dL (ref 0–99)

## 2013-06-19 MED ORDER — LOSARTAN POTASSIUM-HCTZ 50-12.5 MG PO TABS: ORAL_TABLET | ORAL | Status: DC

## 2013-06-19 MED ORDER — AMLODIPINE BESYLATE 5 MG PO TABS: ORAL_TABLET | ORAL | Status: DC

## 2013-06-19 NOTE — Patient Instructions (Signed)
I have refilled your medicines for a year I will send you a note about your lab work---you may pick up your paperwork tomorrow at the front desk Great to see you and have a Happy Holiday!

## 2013-06-20 ENCOUNTER — Encounter: Payer: Self-pay | Admitting: Family Medicine

## 2013-06-20 DIAGNOSIS — Z0000 Encounter for general adult medical examination without abnormal findings: Secondary | ICD-10-CM | POA: Insufficient documentation

## 2013-06-20 NOTE — Progress Notes (Signed)
   Subjective:    Patient ID: Paul Henderson, male    DOB: 01/18/1971, 42 y.o.   MRN: [id number]  HPI Followup hypertension and preventive care. #1. Hypertension. She's been taking his medicines regularly without problem. #2. Preventive care. His insurance is offering discount if he gets certain preventive measures addressed. He brings me a paper to fill out today.   Review of Systems Denies chest pain, shortness of breath, lower strandy edema, palpitations. Denies constipation, denies cough    Objective:   Physical Exam  Vital signs reviewed GENERALl: Well developed, well nourished, in no acute distress. NECK: Supple, FROM, without lymphadenopathy.  THYROID: normal without nodularity CAROTID ARTERIES: without bruits LUNGS: clear to auscultation bilaterally. No wheezes or rales. HEART: Regular rate and rhythm, no murmurs ABDOMEN: soft with positive bowel sounds MSK: MOE x 4 SKIN no rash NEURO: no focal deficits       Assessment & Plan:

## 2013-06-20 NOTE — Assessment & Plan Note (Signed)
Good control. We'll continue current medication regimen. Check some lab work today including cholesterol. Refills given.

## 2013-06-20 NOTE — Assessment & Plan Note (Signed)
We'll check his cholesterol today. His weight has been stable. Recommended exercise.

## 2014-02-04 ENCOUNTER — Encounter: Payer: Self-pay | Admitting: Family Medicine

## 2014-02-04 ENCOUNTER — Ambulatory Visit (INDEPENDENT_AMBULATORY_CARE_PROVIDER_SITE_OTHER): Payer: Commercial Managed Care - PPO | Admitting: Family Medicine

## 2014-02-04 VITALS — BP 120/72 | HR 76 | Temp 98.1°F | Ht 70.0 in | Wt 241.0 lb

## 2014-02-04 DIAGNOSIS — Z0000 Encounter for general adult medical examination without abnormal findings: Secondary | ICD-10-CM

## 2014-02-04 DIAGNOSIS — I10 Essential (primary) hypertension: Secondary | ICD-10-CM

## 2014-02-04 MED ORDER — AMLODIPINE BESYLATE 5 MG PO TABS: ORAL_TABLET | ORAL | Status: DC

## 2014-02-04 MED ORDER — LOSARTAN POTASSIUM-HCTZ 50-12.5 MG PO TABS: ORAL_TABLET | ORAL | Status: DC

## 2014-02-04 NOTE — Patient Instructions (Signed)
Thank you for coming in,   Keep up the good work.    Please feel free to call with any questions or concerns at any time, at [phone]. --Dr. Raeford Razor

## 2014-02-04 NOTE — Progress Notes (Signed)
   Subjective:    Patient ID: Paul Henderson, male    DOB: [birth year], 43 y.o.   MRN: [id number]  HPI  Paul Henderson is here for his annual exam.   He has no complaints at this time. He works at a [employer]. He is compliant with his hypertensive medications. He takes them with no side effects. He denies dyspnea, chest pain, leg swelling or lightheadedness. He .   Current Outpatient Prescriptions on File Prior to Visit  Medication Sig Dispense Refill  . amLODipine (NORVASC) 5 MG tablet TAKE 1 TABLET (5 MG TOTAL) BY MOUTH DAILY.  90 tablet  10  . losartan-hydrochlorothiazide (HYZAAR) 50-12.5 MG per tablet TAKE 1 TABLET BY MOUTH DAILY.  90 tablet  3   No current facility-administered medications on file prior to visit.    SHx: doesn't smoke or drink EtOH or illicit drugs   Health Maintenance: Due for flu vaccine this fall, otherwise up to date.   Review of Systems See HPI     Objective:   Physical Exam BP 120/72  Pulse 76  Temp(Src) 98.1 F (36.7 C) (Oral)  Ht 5\' 10"  (1.778 m)  Wt 241 lb (109.317 kg)  BMI 34.58 kg/m2 Gen: NAD, alert, cooperative with exam, well-appearing HEENT: NCAT, PERRL, clear conjunctiva, oropharynx clear, supple neck CV: RRR, good S1/S2, no murmur, no edema, capillary refill brisk  Resp: CTABL, no wheezes, non-labored Abd: SNTND, BS present, no guarding or organomegaly Skin: no rashes, normal turgor  Neuro: no gross deficits.  Psych: good insight, alert and oriented        Assessment & Plan:

## 2014-02-05 NOTE — Assessment & Plan Note (Signed)
Labs obtained within a year so none today. Otherwise healthy and continue taking hypertensive medications.

## 2015-03-22 ENCOUNTER — Other Ambulatory Visit: Payer: Self-pay | Admitting: Family Medicine

## 2015-06-01 ENCOUNTER — Encounter: Payer: Commercial Managed Care - PPO | Admitting: Family Medicine

## 2015-06-08 ENCOUNTER — Ambulatory Visit (INDEPENDENT_AMBULATORY_CARE_PROVIDER_SITE_OTHER): Payer: BLUE CROSS/BLUE SHIELD | Admitting: Family Medicine

## 2015-06-08 VITALS — BP 120/80 | HR 82 | Temp 98.0°F | Ht 70.0 in | Wt 233.0 lb

## 2015-06-08 DIAGNOSIS — E669 Obesity, unspecified: Secondary | ICD-10-CM | POA: Diagnosis not present

## 2015-06-08 DIAGNOSIS — Z0000 Encounter for general adult medical examination without abnormal findings: Secondary | ICD-10-CM

## 2015-06-08 DIAGNOSIS — I10 Essential (primary) hypertension: Secondary | ICD-10-CM | POA: Diagnosis not present

## 2015-06-08 LAB — POCT URINALYSIS DIPSTICK
BILIRUBIN UA: NEGATIVE
GLUCOSE UA: NEGATIVE
KETONES UA: NEGATIVE
Leukocytes, UA: NEGATIVE
Nitrite, UA: NEGATIVE
Protein, UA: 30
SPEC GRAV UA: 1.02
UROBILINOGEN UA: 0.2
pH, UA: 7

## 2015-06-08 LAB — POCT UA - MICROSCOPIC ONLY

## 2015-06-08 MED ORDER — AMLODIPINE BESYLATE 5 MG PO TABS: 5.0000 mg | ORAL_TABLET | Freq: Every day | ORAL | Status: DC

## 2015-06-08 MED ORDER — LOSARTAN POTASSIUM-HCTZ 50-12.5 MG PO TABS: 1.0000 | ORAL_TABLET | Freq: Every day | ORAL | Status: DC

## 2015-06-08 NOTE — Assessment & Plan Note (Addendum)
Well-controlled. Continue current medications. Lab work today. 

## 2015-06-08 NOTE — Assessment & Plan Note (Signed)
Doing well with no complaints - Follow-up in one year

## 2015-06-08 NOTE — Patient Instructions (Signed)
Thank you for coming in,   I will call or send a letter with today's results.    Please bring all of your medications with you to each visit.   Sign up for My Chart to have easy access to your labs results, and communication with your Primary care physician   Please feel free to call with any questions or concerns at any time, at [phone]. --Dr. Raeford Razor

## 2015-06-08 NOTE — Assessment & Plan Note (Signed)
Has been losing weight and actively exercises - Continue to monitor

## 2015-06-08 NOTE — Progress Notes (Signed)
   Subjective:    RAVON TSOU - 44 y.o. male MRN [id number]  Date of birth: May 14, 1971  HPI  WARDELL HASBARGEN is here for physical.   He has lost weight since last time that he was seen. 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 He denies any current complaints. He exercises at least 30 minutes per day by walking.  HTN Disease Monitoring: Home BP Monitoring intermittent   Medications:amlodipine, hydrochlorothiazide, losartan Chest pain- no     Dyspnea- no Compliance-  yes.  Lightheadedness-  no   Edema- no    Health Maintenance:  Health Maintenance Due  Topic Date Due  . HIV Screening  08/12/1985  . TETANUS/TDAP  08/12/1989    -  reports that he has never smoked. He has never used smokeless tobacco. - Review of Systems: Per HPI. - Past Medical History: Patient Active Problem List   Diagnosis Date Noted  . Obesity 06/08/2015  . Well adult health check 06/20/2013  . HYPERTENSION, BENIGN SYSTEMIC 09/07/2006   - Medications: reviewed and updated Current Outpatient Prescriptions  Medication Sig Dispense Refill  . amLODipine (NORVASC) 5 MG tablet Take 1 tablet (5 mg total) by mouth daily. 90 tablet 3  . losartan-hydrochlorothiazide (HYZAAR) 50-12.5 MG tablet Take 1 tablet by mouth daily. 90 tablet 3   No current facility-administered medications for this visit.     Review of Systems See HPI     Objective:   Physical Exam BP 120/80 mmHg  Pulse 82  Temp(Src) 98 F (36.7 C) (Oral)  Ht 5\' 10"  (1.778 m)  Wt 233 lb (105.688 kg)  BMI 33.43 kg/m2 Gen: NAD, alert, cooperative with exam, well-appearing CV: RRR, good S1/S2, no murmur, no edema,  Resp: CTABL, no wheezes, non-labored Abd: SNTND, BS present, no guarding or organomegaly Skin: no rashes, normal turgor  Neuro: no gross deficits.  Psych: alert and oriented     Assessment & Plan:   Well adult health check Doing well with no complaints - Follow-up in one  year  HYPERTENSION, BENIGN SYSTEMIC Well-controlled - Continue current medications - Lab work today  Obesity Has been losing weight and actively exercises - Continue to monitor

## 2015-06-09 ENCOUNTER — Encounter: Payer: Self-pay | Admitting: Family Medicine

## 2015-06-09 LAB — LIPID PANEL
CHOLESTEROL: 170 mg/dL (ref 125–200)
HDL: 48 mg/dL (ref >=40)
LDL CALC: 92 mg/dL (ref <130)
TRIGLYCERIDES: 150 mg/dL — AB (ref <150)
Total CHOL/HDL Ratio: 3.5 Ratio (ref <=5.0)
VLDL: 30 mg/dL (ref <30)

## 2015-06-09 LAB — COMPLETE METABOLIC PANEL WITH GFR
ALT: 23 U/L (ref 9–46)
AST: 23 U/L (ref 10–40)
Albumin: 4.2 g/dL (ref 3.6–5.1)
Alkaline Phosphatase: 87 U/L (ref 40–115)
BUN: 12 mg/dL (ref 7–25)
CALCIUM: 10.2 mg/dL (ref 8.6–10.3)
CHLORIDE: 101 mmol/L (ref 98–110)
CO2: 31 mmol/L (ref 20–31)
Creat: 0.99 mg/dL (ref 0.60–1.35)
Glucose, Bld: 85 mg/dL (ref 65–99)
Potassium: 4.1 mmol/L (ref 3.5–5.3)
SODIUM: 136 mmol/L (ref 135–146)
Total Bilirubin: 0.6 mg/dL (ref 0.2–1.2)
Total Protein: 7.7 g/dL (ref 6.1–8.1)

## 2015-06-09 LAB — CBC
HEMATOCRIT: 43.2 % (ref 39.0–52.0)
Hemoglobin: 14.2 g/dL (ref 13.0–17.0)
MCH: 29.7 pg (ref 26.0–34.0)
MCHC: 32.9 g/dL (ref 30.0–36.0)
MCV: 90.4 fL (ref 78.0–100.0)
MPV: 10.1 fL (ref 8.6–12.4)
PLATELETS: 332 10*3/uL (ref 150–400)
RBC: 4.78 MIL/uL (ref 4.22–5.81)
RDW: 12.5 % (ref 11.5–15.5)
WBC: 7.7 10*3/uL (ref 4.0–10.5)

## 2015-06-09 LAB — TSH: TSH: 1.166 u[IU]/mL (ref 0.350–4.500)

## 2015-06-10 ENCOUNTER — Telehealth: Payer: Self-pay | Admitting: Family Medicine

## 2015-06-10 NOTE — Telephone Encounter (Signed)
Contacted pt to inform him of below. He is going to call back and schedule for a January appointment. Katharina Caper, Peniel Biel D, [place]

## 2015-06-10 NOTE — Telephone Encounter (Signed)
Pt returned call and i gave him the below information.  He stated that this is something (blood in urine) that Dr. Nori Riis is aware of.  He wanted to know if this is the same as before or if there is something that has changed and if so he would then see about scheduling an appointment or a lab visit.  He would also like his lab results sent to him if that is ok.  Please advise what I need to tell him, and also do you want me to print labs and mail to him? Sending to PCP and Dr. Raeford Razor. Katharina Caper, April D, [place]

## 2015-06-10 NOTE — Telephone Encounter (Signed)
I would like him to make an appointment to be seen by me in January some time [facility]! Paul Henderson

## 2015-06-10 NOTE — Telephone Encounter (Signed)
Left VM for patient. If he calls back please have him speak with a nurse/CMA and inform that he had some blood in his urine. We will need to repeat his urinalysis in 6-8 weeks. I can put a future order in so he can do a lab visit or he can follow up with Dr. Nori Riis or myself. Just let me know what he would prefer.   If any questions then please take the best time and phone number to call and I will try to call him back.   Rosemarie Ax, MD PGY-3, [facility] 06/10/2015, 8:47 AM

## 2015-07-29 ENCOUNTER — Ambulatory Visit: Payer: BLUE CROSS/BLUE SHIELD | Admitting: Family Medicine

## 2015-08-12 ENCOUNTER — Encounter: Payer: Self-pay | Admitting: Family Medicine

## 2015-08-12 ENCOUNTER — Ambulatory Visit (INDEPENDENT_AMBULATORY_CARE_PROVIDER_SITE_OTHER): Payer: BLUE CROSS/BLUE SHIELD | Admitting: Family Medicine

## 2015-08-12 VITALS — BP 135/94 | HR 98 | Temp 97.4°F | Ht 70.0 in | Wt 239.1 lb

## 2015-08-12 DIAGNOSIS — I10 Essential (primary) hypertension: Secondary | ICD-10-CM

## 2015-08-12 DIAGNOSIS — R809 Proteinuria, unspecified: Secondary | ICD-10-CM

## 2015-08-12 DIAGNOSIS — Z23 Encounter for immunization: Secondary | ICD-10-CM | POA: Diagnosis not present

## 2015-08-12 NOTE — Patient Instructions (Signed)
I would recommend doing the ankle exercises as we discussed. I will send you a note about your lab work. Great to see you! Let me recheck you for a physical sometime next November.

## 2015-08-12 NOTE — Progress Notes (Signed)
   Subjective:    Patient ID: Paul Henderson, male    DOB: 1971-04-11, 45 y.o.   MRN: [id number]  HPI He is here for follow-up of hypertension and proteinuria. At last office visit they did some blood work which showed protein in his urine. He was told to follow-up for that. He is trying to walk fairly regularly and follow-up pretty good diet. He is working full-time as a Administrator. He is concerned that diagnosis of proteinuria could interfere with his DOT license.   Review of Systems No lower extremity swelling, no shortness of breath, exercise tolerance is unchanged. Denies chest pain.    Objective:   Physical Exam  Vital signs reviewed. GENERAL: Well-developed, well-nourished, no acute distress. CARDIOVASCULAR: Regular rate and rhythm no murmur gallop or rub LUNGS: Clear to auscultation bilaterally, no rales or wheeze. ABDOMEN: Soft positive bowel sounds NEURO: No gross focal neurological deficits. MSK: Movement of extremity x 4.       Assessment & Plan:

## 2015-08-12 NOTE — Assessment & Plan Note (Signed)
He had trace protein in urine, semiquantitative 30. Creatinine has been stable. He is on and ARB. I will recheck that today but his creatinine has been in good range so don't think were dealing with anything nephrotic. I reassured him. I don't think this should interfere with his DOT physical or license at all. His blood pressure is well controlled on Hyzaar and amlodipine at current dose. I'll send him a note about the lab results and expect to see him back in one year from his physical for repeat lab work. We discussed signs to watch out for such as lower extremity edema or foamy urine, unusual weight change.

## 2015-08-13 LAB — PROTEIN, URINE, RANDOM: TOTAL PROTEIN, URINE: 12 mg/dL (ref 5–25)

## 2015-08-17 ENCOUNTER — Encounter: Payer: Self-pay | Admitting: Family Medicine

## 2016-07-13 ENCOUNTER — Other Ambulatory Visit: Payer: Self-pay | Admitting: Family Medicine

## 2016-07-13 DIAGNOSIS — I10 Essential (primary) hypertension: Secondary | ICD-10-CM

## 2016-12-13 ENCOUNTER — Encounter (HOSPITAL_COMMUNITY): Payer: Self-pay | Admitting: Emergency Medicine

## 2016-12-13 ENCOUNTER — Ambulatory Visit (HOSPITAL_COMMUNITY)
Admission: EM | Admit: 2016-12-13 | Discharge: 2016-12-13 | Disposition: A | Discharge status: Home / Self Care | Payer: BLUE CROSS/BLUE SHIELD | Attending: Internal Medicine | Admitting: Internal Medicine

## 2016-12-13 DIAGNOSIS — W450XXA Nail entering through skin, initial encounter: Secondary | ICD-10-CM

## 2016-12-13 DIAGNOSIS — S91332A Puncture wound without foreign body, left foot, initial encounter: Secondary | ICD-10-CM | POA: Diagnosis not present

## 2016-12-13 DIAGNOSIS — Z23 Encounter for immunization: Secondary | ICD-10-CM | POA: Diagnosis not present

## 2016-12-13 MED ORDER — LEVOFLOXACIN 500 MG PO TABS: 500.0000 mg | ORAL_TABLET | Freq: Every day | ORAL | 0 refills | Status: AC

## 2016-12-13 MED ORDER — TETANUS-DIPHTHERIA TOXOIDS TD 5-2 LFU IM INJ: 0.5000 mL | INJECTION | Freq: Once | INTRAMUSCULAR | Status: DC

## 2016-12-13 MED ORDER — MUPIROCIN 2 % EX OINT: 1.0000 "application " | TOPICAL_OINTMENT | Freq: Two times a day (BID) | CUTANEOUS | 0 refills | Status: AC

## 2016-12-13 MED ORDER — TETANUS-DIPHTH-ACELL PERTUSSIS 5-2.5-18.5 LF-MCG/0.5 IM SUSP
0.5000 mL | Freq: Once | INTRAMUSCULAR | Status: AC
  Administered 2016-12-13: 0.5 mL via INTRAMUSCULAR

## 2016-12-13 MED ORDER — TETANUS-DIPHTH-ACELL PERTUSSIS 5-2.5-18.5 LF-MCG/0.5 IM SUSP
INTRAMUSCULAR | Status: AC
  Filled 2016-12-13: qty 0.5

## 2016-12-13 NOTE — Discharge Instructions (Addendum)
Tetanus booster given today at urgent care.  Wash gently with soap/water 1-2 times daily, apply antibiotic ointment and bandage.  Recheck if any increasing redness/swelling/pain/drainage or new fever>100.5, or if not starting to improve in a few days.   Prescription for antibiotic ointment (mupirocin) and for antibiotic to take by mouth (levofloxacin) were sent to the pharmacy.

## 2016-12-13 NOTE — ED Triage Notes (Signed)
The patient presented to the [facility] to get a TdAP secondary to stepping on a nail 3 days ago.

## 2016-12-13 NOTE — ED Provider Notes (Signed)
[facility]    CSN: [id number] Arrival date & time: 12/13/16  1246     History   Chief Complaint Chief Complaint  Patient presents with  . Foot Pain    HPI Paul Henderson is a 46 y.o. male. He was taking down a playset in the backyard morning before last, stepped on a board that had a nail protruding. Has a puncture mark in the ball of the left foot between the first and second MTPs.  Mildly tender to deep palpation, but not particularly red/swollen, is not draining.    HPI  History reviewed. No pertinent past medical history.  Patient Active Problem List   Diagnosis Date Noted  . Obesity 06/08/2015  . Well adult health check 06/20/2013  . HYPERTENSION, BENIGN SYSTEMIC 09/07/2006    History reviewed. No pertinent surgical history.     Home Medications    Prior to Admission medications   Medication Sig Start Date End Date Taking? Authorizing Provider  amLODipine (NORVASC) 5 MG tablet TAKE ONE TABLET BY MOUTH ONCE DAILY 07/13/16   Dickie La, MD  levofloxacin (LEVAQUIN) 500 MG tablet Take 1 tablet (500 mg total) by mouth daily. 12/13/16 12/18/16  Sherlene Shams, MD  losartan-hydrochlorothiazide [facility]) 50-12.5 MG tablet TAKE ONE TABLET BY MOUTH ONCE DAILY 07/13/16   Dickie La, MD  mupirocin ointment (BACTROBAN) 2 % Apply 1 application topically 2 (two) times daily. 12/13/16 12/20/16  Sherlene Shams, MD    Family History History reviewed. No pertinent family history.  Social History Social History  Substance Use Topics  . Smoking status: Never Smoker  . Smokeless tobacco: Never Used  . Alcohol use No     Allergies   Patient has no known allergies.   Review of Systems Review of Systems  All other systems reviewed and are negative.    Physical Exam Triage Vital Signs ED Triage Vitals [12/13/16 1313]  Enc Vitals Group     BP 134/83     Pulse Rate 85     Resp 18     Temp 98 F (36.7 C)     Temp Source Oral     SpO2 100 %   Weight      Peak Flow      Pain Score 0     Pain Loc    Updated Vital Signs BP 134/83 (BP Location: Right Arm)   Pulse 85   Temp 98 F (36.7 C) (Oral)   Resp 18   SpO2 100%   Physical Exam  Constitutional: He is oriented to person, place, and time. No distress.  Alert, nicely groomed  HENT:  Head: Atraumatic.  Eyes:  Conjugate gaze, no eye redness/drainage  Neck: Neck supple.  Cardiovascular: Normal rate.   Pulmonary/Chest: No respiratory distress.  Abdominal: He exhibits no distension.  Musculoskeletal: Normal range of motion.  Neurological: He is alert and oriented to person, place, and time.  Skin: Skin is warm and dry.  No cyanosis Puncture mark on the sole of the left foot in the ball of the foot between the first and second MTPs. Mildly tender to palpation, not red/swollen/draining.  Nursing note and vitals reviewed.    UC Treatments / Results   Procedures Procedures (including critical care time)  Medications Ordered in UC Medications  Tdap (BOOSTRIX) injection 0.5 mL (0.5 mLs Intramuscular Given 12/13/16 1453)     Final Clinical Impressions(s) / UC Diagnoses   Final diagnoses:  Puncture wound of left foot, initial  encounter   Tetanus booster given today at urgent care.  Wash gently with soap/water 1-2 times daily, apply antibiotic ointment and bandage.  Recheck if any increasing redness/swelling/pain/drainage or new fever>100.5, or if not starting to improve in a few days.   Prescription for antibiotic ointment (mupirocin) and for antibiotic to take by mouth (levofloxacin) were sent to the pharmacy.    New Prescriptions Discharge Medication List as of 12/13/2016  2:54 PM    START taking these medications   Details  levofloxacin (LEVAQUIN) 500 MG tablet Take 1 tablet (500 mg total) by mouth daily., Starting Tue 12/13/2016, Until Sun 12/18/2016, Normal    mupirocin ointment (BACTROBAN) 2 % Apply 1 application topically 2 (two) times daily., Starting Tue  12/13/2016, Until Tue 12/20/2016, Normal         Sherlene Shams, MD 12/14/16 1310

## 2017-06-09 ENCOUNTER — Telehealth: Payer: Self-pay | Admitting: *Deleted

## 2017-06-09 NOTE — Telephone Encounter (Signed)
Pt calls nurse line concerned because he has heard there is a recall on both of his BP meds.   Advised that I was unaware of recalls on his medication, but we could check with his provider just to be sure.   Pt states that he saw a recall on amlodipine on news 2 this am. Fleeger, Salome Spotted, [place]

## 2017-06-12 NOTE — Telephone Encounter (Signed)
Spoke with pt. He has called his pharmacy and his medication is ok. Ottis Stain, CMA

## 2017-06-12 NOTE — Telephone Encounter (Signed)
Dear Dema Severin Team There were only a few lots of the medication from a specific manufacturer  recalled---if he has not heard from  His pharmacy then he should be [place]. If he has questions, he can call his pharmacy and ask them to check the lots numbers. THANKS! [name]

## 2017-07-28 ENCOUNTER — Other Ambulatory Visit: Payer: Self-pay | Admitting: Family Medicine

## 2017-07-28 DIAGNOSIS — I10 Essential (primary) hypertension: Secondary | ICD-10-CM

## 2017-10-10 ENCOUNTER — Telehealth: Payer: Self-pay

## 2017-10-10 NOTE — Telephone Encounter (Signed)
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 However, cramping is coming more frequently and even wakes him up at night. He wanted PCP's advice. Call back is [phone]. Danley Danker, RN [facility] RN)

## 2017-10-11 NOTE — Telephone Encounter (Signed)
Dear Dema Severin Team Can up have him make an appointment with me in next 2-4 weeks. I will have to examine him, talk with him and get labs--it is not something I can do over the phone [facility]! [name]

## 2017-10-12 NOTE — Telephone Encounter (Signed)
Spoke to pt and scheduled an appt for him to see Dr. Nori Riis on 11/08/2017. Ottis Stain, CMA

## 2017-11-08 ENCOUNTER — Ambulatory Visit: Payer: BLUE CROSS/BLUE SHIELD | Admitting: Family Medicine

## 2017-11-29 ENCOUNTER — Ambulatory Visit: Payer: BLUE CROSS/BLUE SHIELD | Admitting: Family Medicine

## 2017-12-14 ENCOUNTER — Other Ambulatory Visit: Payer: Self-pay

## 2017-12-14 DIAGNOSIS — I10 Essential (primary) hypertension: Secondary | ICD-10-CM

## 2017-12-14 NOTE — Telephone Encounter (Signed)
Received fax from pharmacy, the pended medication is still on backorder and they would like to split ingredients, per their note. If ok by you, I will call pharmacy to let them know.

## 2017-12-18 MED ORDER — LOSARTAN POTASSIUM-HCTZ 50-12.5 MG PO TABS: 1.0000 | ORAL_TABLET | Freq: Every day | ORAL | 11 refills | Status: DC

## 2018-01-03 ENCOUNTER — Ambulatory Visit: Payer: BLUE CROSS/BLUE SHIELD | Admitting: Family Medicine

## 2018-02-12 ENCOUNTER — Telehealth: Payer: Self-pay | Admitting: Family Medicine

## 2018-02-12 NOTE — Telephone Encounter (Signed)
Pt is having problems getting his losartan refilled.  Per [facility], they are telling him the pills are on back order.  Pharmacy doesn't know when they will get it.  Could dr neal prescribe him something comparable? Please advise

## 2018-02-13 MED ORDER — CANDESARTAN CILEXETIL-HCTZ 32-12.5 MG PO TABS: 1.0000 | ORAL_TABLET | Freq: Every day | ORAL | 3 refills | Status: DC

## 2018-02-19 ENCOUNTER — Other Ambulatory Visit: Payer: Self-pay

## 2018-02-19 MED ORDER — CANDESARTAN CILEXETIL-HCTZ 32-12.5 MG PO TABS: 1.0000 | ORAL_TABLET | Freq: Every day | ORAL | 3 refills | Status: DC

## 2018-02-28 ENCOUNTER — Telehealth: Payer: Self-pay

## 2018-02-28 MED ORDER — LISINOPRIL-HYDROCHLOROTHIAZIDE 20-12.5 MG PO TABS: 1.0000 | ORAL_TABLET | Freq: Every day | ORAL | 3 refills | Status: DC

## 2018-02-28 NOTE — Telephone Encounter (Signed)
Spoke with patient. According to [facility], Candesartan also not available. Stated that Suzie Portela has been trying to contact PCP but no record seen of that. Needs a different alternative med since Losartan and Candesartan both unavailable.   Patient call back is [phone].  Danley Danker, RN [facility] RN)

## 2018-02-28 NOTE — Telephone Encounter (Signed)
Ok I reviewed his chart and no sign of ACE allergy so I am switching him to ACE combo. Please let him know and tell him if he develops CHRONIc cough to let me know as that is potential side effect, although not common. I sent in new Rx THANKS! [name]

## 2018-02-28 NOTE — Telephone Encounter (Signed)
Pt informed. Pt will call [facility] tomorrow and will let us know if there is a problem. Ottis Stain, CMA

## 2018-02-28 NOTE — Telephone Encounter (Signed)
Pt left message on nurse line requesting a call back regarding BP medications. Call back [phone] Attempted to return call, no answer. Left VM for pt to call back Wallace Cullens, RN

## 2018-03-09 ENCOUNTER — Telehealth: Payer: Self-pay

## 2018-03-09 DIAGNOSIS — I10 Essential (primary) hypertension: Secondary | ICD-10-CM

## 2018-03-09 MED ORDER — LOSARTAN POTASSIUM-HCTZ 100-25 MG PO TABS: 0.5000 | ORAL_TABLET | Freq: Every day | ORAL | 3 refills | Status: DC

## 2018-03-09 NOTE — Telephone Encounter (Signed)
Would recommend switching back to ARB. Cough with ACE inhibitors typically seen in 1-2 weeks. Appears he tolerated ARB well in past, switched due to availability. Can you please call his pharmacy and see which ARB is available (they did not have Candesartan or Losartan in past, perhaps they have irbesartan?)   Thanks, [employer]

## 2018-03-09 NOTE — Telephone Encounter (Signed)
Spoke with [facility]. They only have the Candesartan - HCTZ 100-12.5 but unfortunately the pt is on 50-12.5 dose. She states they do have have any other ARBs in stock at this time.    Will forward to MD.  Nevea Spiewak, Salome Spotted, [place]

## 2018-03-09 NOTE — Telephone Encounter (Addendum)
Called [facility]. Losartan-HCTZ 100-25 available.   Rx to pharmacy. He should take half a pill per day. Confirmed with pharmacist this can be split.   Please call patient and let him know.

## 2018-03-09 NOTE — Telephone Encounter (Signed)
Pt informed and very appreciative. Delorese Sellin, Salome Spotted, CMA

## 2018-03-09 NOTE — Telephone Encounter (Signed)
Patient called nurse clinic. States he has a cough since starting new BP med. States that he is even waking up at night to cough.   States no other symptoms, no congestion, no runny nose, no itchy eyes, no sinus pressure.   He believes it is the medication and would like it changed.  Call back is [phone]  Danley Danker, RN [facility])

## 2018-07-12 ENCOUNTER — Ambulatory Visit: Payer: BLUE CROSS/BLUE SHIELD | Admitting: Family Medicine

## 2018-07-13 ENCOUNTER — Telehealth: Payer: Self-pay

## 2018-07-13 MED ORDER — CANDESARTAN CILEXETIL-HCTZ 32-25 MG PO TABS: 1.0000 | ORAL_TABLET | Freq: Every day | ORAL | 3 refills | Status: DC

## 2018-07-13 NOTE — Telephone Encounter (Signed)
Dear Paul Henderson Team please let him know I changed him to a different but similar medicine and called it in [facility]! [name]

## 2018-07-13 NOTE — Telephone Encounter (Signed)
Fax from pharmacy that Losartan/HCTZ on backorder with no release date. Please split into two separate prescriptions.  Danley Danker, RN [facility] RN)

## 2018-07-13 NOTE — Telephone Encounter (Signed)
LVM to call office back to inform him of below.Zimmerman Rumple, Margerite Impastato D, CMA  

## 2018-07-25 ENCOUNTER — Ambulatory Visit: Payer: BLUE CROSS/BLUE SHIELD | Admitting: Family Medicine

## 2018-08-13 ENCOUNTER — Other Ambulatory Visit: Payer: Self-pay | Admitting: Family Medicine

## 2018-08-13 DIAGNOSIS — I10 Essential (primary) hypertension: Secondary | ICD-10-CM

## 2018-08-13 NOTE — Telephone Encounter (Signed)
Await PCP decision

## 2018-08-14 NOTE — Telephone Encounter (Signed)
Pt calls nurse line stating his pharmacy informed him his rx has not yet been approved. I informed patient he has not been seen in a while and the covering MD is leaving the decision up to PCP. 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 Pt is going to run out of medication between now and when PCP comes back. Pt seemed very worried about this. Please advise.

## 2018-09-04 ENCOUNTER — Other Ambulatory Visit: Payer: Self-pay

## 2018-09-04 ENCOUNTER — Encounter: Payer: Self-pay | Admitting: Family Medicine

## 2018-09-04 ENCOUNTER — Ambulatory Visit: Payer: BLUE CROSS/BLUE SHIELD | Admitting: Family Medicine

## 2018-09-04 VITALS — BP 124/78 | HR 100 | Temp 98.3°F | Ht 70.0 in | Wt 249.6 lb

## 2018-09-04 DIAGNOSIS — Z6835 Body mass index (BMI) 35.0-35.9, adult: Secondary | ICD-10-CM

## 2018-09-04 DIAGNOSIS — L989 Disorder of the skin and subcutaneous tissue, unspecified: Secondary | ICD-10-CM | POA: Diagnosis not present

## 2018-09-04 DIAGNOSIS — E6609 Other obesity due to excess calories: Secondary | ICD-10-CM

## 2018-09-04 DIAGNOSIS — I10 Essential (primary) hypertension: Secondary | ICD-10-CM

## 2018-09-04 MED ORDER — FLUTICASONE PROPIONATE 50 MCG/ACT NA SUSP: 2.0000 | Freq: Every day | NASAL | 6 refills | Status: DC

## 2018-09-04 NOTE — Patient Instructions (Signed)
I will send you a note about your blood work  I would like to see you back in 6 months or so just for a check up

## 2018-09-05 LAB — CMP14+EGFR
ALK PHOS: 76 IU/L (ref 39–117)
Albumin/Globulin Ratio: 1.3 (ref 1.2–2.2)
Albumin: 4.2 g/dL (ref 4.0–5.0)
BUN/Creatinine Ratio: 11 (ref 9–20)
BUN: 15 mg/dL (ref 6–24)
Bilirubin Total: 0.6 mg/dL (ref 0.0–1.2)
CO2: 24 mmol/L (ref 20–29)
CREATININE: 1.41 mg/dL — AB (ref 0.76–1.27)
Calcium: 9.9 mg/dL (ref 8.7–10.2)
Chloride: 96 mmol/L (ref 96–106)
GFR calc Af Amer: 68 mL/min/{1.73_m2} (ref >59)
GFR, EST NON AFRICAN AMERICAN: 58 mL/min/{1.73_m2} — AB (ref >59)
GLOBULIN, TOTAL: 3.3 g/dL (ref 1.5–4.5)
GLUCOSE: 91 mg/dL (ref 65–99)
Potassium: 4.5 mmol/L (ref 3.5–5.2)
Sodium: 138 mmol/L (ref 134–144)
Total Protein: 7.5 g/dL (ref 6.0–8.5)

## 2018-09-05 LAB — MICROALBUMIN, URINE: MICROALBUM., U, RANDOM: 236.6 ug/mL

## 2018-09-05 LAB — LDL CHOLESTEROL, DIRECT: LDL DIRECT: 121 mg/dL — AB (ref 0–99)

## 2018-09-06 ENCOUNTER — Encounter: Payer: Self-pay | Admitting: Family Medicine

## 2018-09-06 MED ORDER — AMLODIPINE BESYLATE 5 MG PO TABS: 5.0000 mg | ORAL_TABLET | Freq: Every day | ORAL | 3 refills | Status: DC

## 2018-09-06 MED ORDER — CANDESARTAN CILEXETIL-HCTZ 32-25 MG PO TABS: 1.0000 | ORAL_TABLET | Freq: Every day | ORAL | 3 refills | Status: DC

## 2018-09-06 NOTE — Assessment & Plan Note (Signed)
Not really doing any regular exercise and we discussed this.  He says his wife has been nagging him to go to the gym with her.

## 2018-09-06 NOTE — Assessment & Plan Note (Signed)
Labs today.  Refills today.  Really would like him to come in a little more frequently and we discussed that.

## 2018-09-06 NOTE — Progress Notes (Signed)
    CHIEF COMPLAINT / HPI: #1 hypertension: Needs some refills.  Taking his medicines regularly.  Has some questions.  Denies chest pain.  No shortness of breath. #2.  Also may look it up mass on the back part of his neck.  He thinks it is just excess skin.  Has been there many months.  Not sure if it is gotten a little bigger or not.  His wife noticed it.  No pain.  No change in neck range of motion. #3.  Follow-up proteinuria: And been told once in the past that he had elevated protein in his urine.  He wants to get that checked. #4.  Left posterior back/hip pain.  Intermittent.  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  Sharp.  Does not radiate.  Self resolves.  4-6 out of 10 at its worst. #5.  Does have a lot of nasal congestion and use some of his wife's Flonase and it seems to really help.  Wants to know if I can give him a prescription for that.  REVIEW OF SYSTEMS: See HPI.  Additional pertinent review of systems negative for urinary frequency, no blood in urine.  Energy level is normal.  No constipation, no diarrhea.  PERTINENT  PMH / PSH: I have reviewed the patient's medications, allergies, past medical and surgical history, smoking status and updated in the EMR as appropriate.   OBJECTIVE:  Vital signs reviewed. GENERAL: Well-developed, well-nourished, no acute distress. CARDIOVASCULAR: Regular rate and rhythm no murmur gallop or rub LUNGS: Clear to auscultation bilaterally, no rales or wheeze. ABDOMEN: Soft positive bowel sounds NEURO: No gross focal neurological deficits.  Intact sensation soft touch bilateral feet and hands. MSK: Movement of extremity x 4.  Bilateral hips have full range of motion internal and external rotation and it is painless.  Corky Sox and Fadir range of motion. HEENT: Nasal turbinates are somewhat boggy and swollen with mild hyperemia.  TMs have normal landmarks.  Oropharynx is clear.  NECK:  Full range of motion.  Supple.  No lymphadenopathy, no thyromegaly, no carotid bruits.  Spurling's negative.  The area he is concerned about this in the posterior soft tissues of the neck.  Mildly thickened there but does not feel abnormal, specifically there is no induration no distinct mass. SKIN of neck without any unusual rash, no erythema, no unusual warmth, no lesions     ASSESSMENT / PLAN: 1. SKIN abnormality:Concerned about thickened area on the soft tissues of posterior neck: I do not think this is anything to worry about.  If it changes in any way, he will follow-up.  I do not feel a distinct mass at all. HYPERTENSION, BENIGN SYSTEMIC Labs today.  Refills today.  Really would like him to come in a little more frequently and we discussed that.  Obesity Not really doing any regular exercise and we discussed this.  He says his wife has been nagging him to go to the gym with her. He has gained approximately 10 pounds since I saw him last and his BMI is now 35. We discussed exercise at length.

## 2018-09-11 ENCOUNTER — Encounter: Payer: Self-pay | Admitting: Family Medicine

## 2018-12-24 ENCOUNTER — Telehealth: Payer: Self-pay | Admitting: *Deleted

## 2018-12-24 NOTE — Telephone Encounter (Signed)
Pt calls because his candesartan is on backorder.  Can something else be called in? Christen Bame, [place]

## 2018-12-24 NOTE — Telephone Encounter (Signed)
Spoke to pharmacy. They do have the components separately. Please send new Rx. Ottis Stain, CMA

## 2018-12-24 NOTE — Telephone Encounter (Signed)
Dear Dema Severin Team Can you call his pharmacy and see if the individual components--candesartan  Is on back order or is it just the combo. If just the combo, I will write for that and HCTZ separately THANKS! [name]

## 2018-12-26 MED ORDER — CANDESARTAN CILEXETIL 32 MG PO TABS: 32.0000 mg | ORAL_TABLET | Freq: Every day | ORAL | 3 refills | Status: DC

## 2018-12-26 MED ORDER — HYDROCHLOROTHIAZIDE 25 MG PO TABS: 25.0000 mg | ORAL_TABLET | Freq: Every day | ORAL | 3 refills | Status: DC

## 2018-12-26 NOTE — Telephone Encounter (Signed)
Pt calling to check status. Jessica Fleeger, CMA  

## 2019-07-30 ENCOUNTER — Other Ambulatory Visit: Payer: Self-pay

## 2019-07-30 MED ORDER — CANDESARTAN CILEXETIL-HCTZ 32-25 MG PO TABS: 1.0000 | ORAL_TABLET | Freq: Every day | ORAL | 0 refills | Status: DC

## 2019-09-03 ENCOUNTER — Ambulatory Visit: Payer: BLUE CROSS/BLUE SHIELD | Admitting: Family Medicine

## 2019-09-03 ENCOUNTER — Encounter: Payer: Self-pay | Admitting: Family Medicine

## 2019-09-03 ENCOUNTER — Other Ambulatory Visit: Payer: Self-pay

## 2019-09-03 VITALS — BP 108/72 | HR 90 | Wt 253.2 lb

## 2019-09-03 DIAGNOSIS — E785 Hyperlipidemia, unspecified: Secondary | ICD-10-CM | POA: Diagnosis not present

## 2019-09-03 DIAGNOSIS — R7989 Other specified abnormal findings of blood chemistry: Secondary | ICD-10-CM | POA: Diagnosis not present

## 2019-09-03 DIAGNOSIS — Z0000 Encounter for general adult medical examination without abnormal findings: Secondary | ICD-10-CM | POA: Diagnosis not present

## 2019-09-03 DIAGNOSIS — I10 Essential (primary) hypertension: Secondary | ICD-10-CM

## 2019-09-03 DIAGNOSIS — Z23 Encounter for immunization: Secondary | ICD-10-CM

## 2019-09-03 MED ORDER — CANDESARTAN CILEXETIL-HCTZ 32-25 MG PO TABS: 1.0000 | ORAL_TABLET | Freq: Every day | ORAL | 0 refills | Status: DC

## 2019-09-03 MED ORDER — AMLODIPINE BESYLATE 5 MG PO TABS: 5.0000 mg | ORAL_TABLET | Freq: Every day | ORAL | 3 refills | Status: DC

## 2019-09-03 MED ORDER — FLUTICASONE PROPIONATE 50 MCG/ACT NA SUSP: 2.0000 | Freq: Every day | NASAL | 6 refills | Status: DC

## 2019-09-03 NOTE — Patient Instructions (Signed)
It was a pleasure to see you today! Thank you for choosing [facility] for your primary care. Paul Henderson was seen for physical and medication refill. Come back to the clinic if there is anything we can do for you.  Today we talked about ordering some labs to track your kidney function, cholesterol, and an HIV screen.  We will reach out to you if these require some sort of response so that he can schedule a meeting with Dr. Nori Riis to discuss it.  Rebound you been feeling well and has no concerning symptoms, if you develop any please reach out to [place] immediately   Please bring all your medications to every doctors visit   Sign up for My Chart to have easy access to your labs results, and communication with your Primary care physician.     Please check-out at the front desk before leaving the clinic.     Best,  Dr. Sherene Sires FAMILY MEDICINE RESIDENT - PGY3 09/03/2019 3:30 PM

## 2019-09-03 NOTE — Progress Notes (Signed)
    SUBJECTIVE:   CHIEF COMPLAINT / HPI:   Patient here for "checkup ", said he would like to get all of his regular health maintenance started and get some blood work that he has not had done in a while.  He does feel he is reasonably healthy as he gets a DOT physical annually.  No new physical complaints to report.  PERTINENT  PMH / PSH: History of elevated LDL, did have 1 reading indicating potential AKI versus worsening kidney function in February 2020.  OBJECTIVE:   BP 108/72   Pulse 90   Wt 253 lb 3.2 oz (114.9 kg)   SpO2 97%   BMI 36.33 kg/m   General: Alert, pleasant, no distress Respiratory: Clear lungs, no stridor, no work of breathing, no cough Cardiac: Regular rate and rhythm, no murmur auscultated  ASSESSMENT/PLAN:   Essential hypertension Well-controlled, no new symptoms, continue current plan  Well adult health check No concerning new symptoms, we did discuss the topic of colon cancer screening after he turns 50.  We discussed the pros and cons of colonoscopy versus potential Cologuard and patient will weigh these decisions and discuss them with his PCP  Hyperlipidemia Prior lab finding of hyperlipidemia, new lipid panel drawn and ASCVD shows 10-year risk at 6% which is borderline increased.  Guidelines indicate potential of starting moderate intensity statin, patient said he wanted to discuss this with Dr. Nori Riis at his next visit so I will defer to her.  Need for immunization against influenza Flu shot requested and given  Elevated serum creatinine Serum creatinine 1.4 drawn in February 2020 which seem to be above baseline around 1.0.  Redraw BMP shows no concerning urgent findings with serum creatinine now at 1.22.     Sherene Sires, [place]

## 2019-09-04 LAB — BASIC METABOLIC PANEL
BUN/Creatinine Ratio: 12 (ref 9–20)
BUN: 15 mg/dL (ref 6–24)
CO2: 24 mmol/L (ref 20–29)
Calcium: 9.7 mg/dL (ref 8.7–10.2)
Chloride: 99 mmol/L (ref 96–106)
Creatinine, Ser: 1.22 mg/dL (ref 0.76–1.27)
GFR calc Af Amer: 80 mL/min/{1.73_m2} (ref >59)
GFR calc non Af Amer: 69 mL/min/{1.73_m2} (ref >59)
Glucose: 81 mg/dL (ref 65–99)
Potassium: 4.4 mmol/L (ref 3.5–5.2)
Sodium: 137 mmol/L (ref 134–144)

## 2019-09-04 LAB — MICROALBUMIN / CREATININE URINE RATIO
Creatinine, Urine: 205.4 mg/dL
Microalb/Creat Ratio: 22 mg/g creat (ref 0–29)
Microalbumin, Urine: 46 ug/mL

## 2019-09-04 LAB — LIPID PANEL
Chol/HDL Ratio: 3.9 ratio (ref 0.0–5.0)
Cholesterol, Total: 167 mg/dL (ref 100–199)
HDL: 43 mg/dL (ref >39)
LDL Chol Calc (NIH): 103 mg/dL — ABNORMAL HIGH (ref 0–99)
Triglycerides: 116 mg/dL (ref 0–149)
VLDL Cholesterol Cal: 21 mg/dL (ref 5–40)

## 2019-09-04 LAB — HIV ANTIBODY (ROUTINE TESTING W REFLEX): HIV Screen 4th Generation wRfx: NONREACTIVE

## 2019-09-05 DIAGNOSIS — E785 Hyperlipidemia, unspecified: Secondary | ICD-10-CM | POA: Insufficient documentation

## 2019-09-05 DIAGNOSIS — R7989 Other specified abnormal findings of blood chemistry: Secondary | ICD-10-CM | POA: Insufficient documentation

## 2019-09-05 DIAGNOSIS — Z23 Encounter for immunization: Secondary | ICD-10-CM | POA: Insufficient documentation

## 2019-09-05 NOTE — Assessment & Plan Note (Signed)
Well-controlled, no new symptoms, continue current plan

## 2019-09-05 NOTE — Assessment & Plan Note (Signed)
No concerning new symptoms, we did discuss the topic of colon cancer screening after he turns 50.  We discussed the pros and cons of colonoscopy versus potential Cologuard and patient will weigh these decisions and discuss them with his PCP

## 2019-09-05 NOTE — Assessment & Plan Note (Signed)
Flu shot requested and given

## 2019-09-05 NOTE — Assessment & Plan Note (Signed)
Serum creatinine 1.4 drawn in February 2020 which seem to be above baseline around 1.0.  Redraw BMP shows no concerning urgent findings with serum creatinine now at 1.22.

## 2019-09-05 NOTE — Assessment & Plan Note (Signed)
Prior lab finding of hyperlipidemia, new lipid panel drawn and ASCVD shows 10-year risk at 6% which is borderline increased.  Guidelines indicate potential of starting moderate intensity statin, patient said he wanted to discuss this with Dr. Nori Riis at his next visit so I will defer to her.

## 2019-12-03 ENCOUNTER — Other Ambulatory Visit: Payer: Self-pay

## 2019-12-03 DIAGNOSIS — I10 Essential (primary) hypertension: Secondary | ICD-10-CM

## 2019-12-03 NOTE — Telephone Encounter (Signed)
Patient calls nurse line stating that he needs refills for amlodipine and candesartan to be one month supplies with 12 refills, as his insurance does not cover 3 month supplies.   To PCP  Please advise  Talbot Grumbling, RN

## 2019-12-05 MED ORDER — CANDESARTAN CILEXETIL-HCTZ 32-25 MG PO TABS: 1.0000 | ORAL_TABLET | Freq: Every day | ORAL | 9 refills | Status: DC

## 2020-08-06 ENCOUNTER — Ambulatory Visit: Payer: BC Managed Care – PPO | Admitting: Family Medicine

## 2020-08-06 ENCOUNTER — Encounter: Payer: Self-pay | Admitting: Family Medicine

## 2020-08-06 ENCOUNTER — Other Ambulatory Visit: Payer: Self-pay

## 2020-08-06 DIAGNOSIS — I10 Essential (primary) hypertension: Secondary | ICD-10-CM | POA: Diagnosis not present

## 2020-08-06 MED ORDER — AMLODIPINE BESYLATE 5 MG PO TABS: 5.0000 mg | ORAL_TABLET | Freq: Every day | ORAL | 11 refills | Status: DC

## 2020-08-06 MED ORDER — CANDESARTAN CILEXETIL-HCTZ 32-25 MG PO TABS: 1.0000 | ORAL_TABLET | Freq: Every day | ORAL | 8 refills | Status: DC

## 2020-08-06 MED ORDER — FLUTICASONE PROPIONATE 50 MCG/ACT NA SUSP: 2.0000 | Freq: Every day | NASAL | 6 refills | Status: DC

## 2020-08-06 NOTE — Patient Instructions (Signed)
It was great seeing you today.  I have sent prescriptions in for your refills for your medications.  Let me know if you have any issues with these medications.  Regarding your physical you will need to schedule an appointment for that to happen and approximately 1 month.  You can have your lab work collected at that time.  If you have any questions, concerns, issues please let me know.  I hope you have a wonderful afternoon!

## 2020-08-06 NOTE — Progress Notes (Signed)
    SUBJECTIVE:   CHIEF COMPLAINT / HPI:   Medication concerns Patient reports that he is here because the prescriptions for his medications were not filled correctly and needs to get back on the right schedule.  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  He reports that they switched insurances and his insurance will not pay for 29-month supplies of his medications and so when the 53-month supplies were sent in he cannot have them filled.  He needs 1 month supplies for 1 year.  He also needs a physical but realizes he has to wait until February to get a physical and will return for that.  PERTINENT  PMH / PSH: Hypertension, hyperlipidemia  OBJECTIVE:   BP 124/72   Pulse (!) 101   Ht 5\' 10"  (1.778 m)   Wt 252 lb 3.2 oz (114.4 kg)   SpO2 98%   BMI 36.19 kg/m   General: Well-appearing, no acute distress Cardiac: Regular rate and rhythm, no murmurs appreciated Respiratory: Breathing, lungs clear to auscultation bilaterally Abdomen: Soft, nontender, positive bowel sounds Extremities: No edema noted, no gross abnormalities noted  ASSESSMENT/PLAN:   Essential hypertension Patient's blood pressures are well controlled on Norvasc and candesartan-hydrochlorothiazide combination pill.  He needs refills for them to be 1 month supply with 11 refills.  These refills have been sent to the patient's pharmacy.  He will return next month for his annual physical.  No further questions or concerns at this time.     Gifford Shave, MD [place]

## 2020-08-07 NOTE — Assessment & Plan Note (Signed)
Patient's blood pressures are well controlled on Norvasc and candesartan-hydrochlorothiazide combination pill.  He needs refills for them to be 1 month supply with 11 refills.  These refills have been sent to the patient's pharmacy.  He will return next month for his annual physical.  No further questions or concerns at this time.

## 2021-02-25 ENCOUNTER — Other Ambulatory Visit: Payer: Self-pay | Admitting: Family Medicine

## 2021-02-25 DIAGNOSIS — I10 Essential (primary) hypertension: Secondary | ICD-10-CM

## 2021-05-19 ENCOUNTER — Other Ambulatory Visit: Payer: Self-pay

## 2021-05-19 DIAGNOSIS — I10 Essential (primary) hypertension: Secondary | ICD-10-CM

## 2021-05-20 MED ORDER — AMLODIPINE BESYLATE 5 MG PO TABS: 5.0000 mg | ORAL_TABLET | Freq: Every day | ORAL | 0 refills | Status: DC

## 2021-05-26 ENCOUNTER — Other Ambulatory Visit: Payer: Self-pay | Admitting: Family Medicine

## 2021-05-26 DIAGNOSIS — I10 Essential (primary) hypertension: Secondary | ICD-10-CM

## 2021-09-01 ENCOUNTER — Encounter: Payer: Self-pay | Admitting: Family Medicine

## 2021-09-01 ENCOUNTER — Ambulatory Visit (INDEPENDENT_AMBULATORY_CARE_PROVIDER_SITE_OTHER): Payer: BC Managed Care – PPO | Admitting: Family Medicine

## 2021-09-01 ENCOUNTER — Encounter: Payer: BC Managed Care – PPO | Admitting: Family Medicine

## 2021-09-01 ENCOUNTER — Other Ambulatory Visit: Payer: Self-pay

## 2021-09-01 VITALS — BP 121/86 | HR 100 | Ht 70.0 in | Wt 255.0 lb

## 2021-09-01 DIAGNOSIS — Z6835 Body mass index (BMI) 35.0-35.9, adult: Secondary | ICD-10-CM

## 2021-09-01 DIAGNOSIS — R7989 Other specified abnormal findings of blood chemistry: Secondary | ICD-10-CM | POA: Diagnosis not present

## 2021-09-01 DIAGNOSIS — E6609 Other obesity due to excess calories: Secondary | ICD-10-CM | POA: Diagnosis not present

## 2021-09-01 DIAGNOSIS — I10 Essential (primary) hypertension: Secondary | ICD-10-CM

## 2021-09-01 DIAGNOSIS — Z0000 Encounter for general adult medical examination without abnormal findings: Secondary | ICD-10-CM

## 2021-09-01 MED ORDER — FLUTICASONE PROPIONATE 50 MCG/ACT NA SUSP: 2.0000 | Freq: Every day | NASAL | 3 refills | Status: DC

## 2021-09-01 MED ORDER — CANDESARTAN CILEXETIL-HCTZ 32-25 MG PO TABS: 1.0000 | ORAL_TABLET | Freq: Every day | ORAL | 3 refills | Status: DC

## 2021-09-01 MED ORDER — AMLODIPINE BESYLATE 5 MG PO TABS: 5.0000 mg | ORAL_TABLET | Freq: Every day | ORAL | 3 refills | Status: DC

## 2021-09-01 NOTE — Assessment & Plan Note (Signed)
He has excellent control.  Refills.  Labs today.  I would like to see him in 6 months but at the minimum he needs to be seen yearly.

## 2021-09-01 NOTE — Progress Notes (Addendum)
° ° °  CHIEF COMPLAINT / HPI: #1 hypertension: Been taking his medicines regularly without problem.  No lower extremity swelling.  Needs new prescriptions.  No chest pain. 2.  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  In the week he will drive 3254 miles.  Has weekends at home but they seem to be filled up with chores. #3.  History of elevated creatinine.  He has been quite worried about this and wants to definitely get it checked.  Also has a lot of questions.    PERTINENT  PMH / PSH: I have reviewed the patients medications, allergies, past medical and surgical history, smoking status and updated in the EMR as appropriate.   OBJECTIVE:  BP 121/86    Pulse 100    Ht 5\' 10"  (1.778 m)    Wt 255 lb (115.7 kg)    BMI 36.59 kg/m   Vital signs reviewed. GENERAL: Well-developed, well-nourished, no acute distress. CARDIOVASCULAR: Regular rate and rhythm no murmur gallop or rub LUNGS: Clear to auscultation bilaterally, no rales or wheeze. ABDOMEN: Soft positive bowel sounds NEURO: No gross focal neurological deficits. MSK: Movement of extremity x 4.  ASSESSMENT / PLAN:   Elevated serum creatinine We discussed kidney function in general and reviewed his creatinine levels over the last few years.  We will recheck labs today as well as some micro albumin creatinine ratio.  I suspect his hypertension was inadequately treated for quite a few years and this may be the source of his elevated kidney function.  Essential hypertension He has excellent control.  Refills.  Labs today.  I would like to see him in 6 months but at the minimum he needs to be seen yearly.  Obesity Reviewed his last lipid panel which was okay.  Discussed adequate exercise.  Difficult for him given his occupation.  Sounds like he is eating a healthy diet.  Healthcare maintenance Info on  colonoscopy scheduling given Discussed need for screening   [name] MD

## 2021-09-01 NOTE — Assessment & Plan Note (Signed)
Reviewed his last lipid panel which was okay.  Discussed adequate exercise.  Difficult for him given his occupation.  Sounds like he is eating a healthy diet.

## 2021-09-01 NOTE — Assessment & Plan Note (Signed)
We discussed kidney function in general and reviewed his creatinine levels over the last few years.  We will recheck labs today as well as some micro albumin creatinine ratio.  I suspect his hypertension was inadequately treated for quite a few years and this may be the source of his elevated kidney function.

## 2021-09-01 NOTE — Assessment & Plan Note (Signed)
Info on colonoscopy scheduling given Discussed need for screening

## 2021-09-01 NOTE — Patient Instructions (Signed)
Please call and set up your colonoscopy. I will send you a note about your labs.  I have refilled your presciptions for a year. Great to see you!

## 2021-09-02 LAB — MICROALBUMIN / CREATININE URINE RATIO
Creatinine, Urine: 95 mg/dL
Microalb/Creat Ratio: 106 mg/g creat — ABNORMAL HIGH (ref 0–29)
Microalbumin, Urine: 100.3 ug/mL

## 2021-09-02 LAB — COMPREHENSIVE METABOLIC PANEL
ALT: 21 IU/L (ref 0–44)
AST: 20 IU/L (ref 0–40)
Albumin/Globulin Ratio: 1.5 (ref 1.2–2.2)
Albumin: 4.6 g/dL (ref 3.8–4.9)
Alkaline Phosphatase: 81 IU/L (ref 44–121)
BUN/Creatinine Ratio: 16 (ref 9–20)
BUN: 17 mg/dL (ref 6–24)
Bilirubin Total: 0.5 mg/dL (ref 0.0–1.2)
CO2: 24 mmol/L (ref 20–29)
Calcium: 10.1 mg/dL (ref 8.7–10.2)
Chloride: 101 mmol/L (ref 96–106)
Creatinine, Ser: 1.07 mg/dL (ref 0.76–1.27)
Globulin, Total: 3 g/dL (ref 1.5–4.5)
Glucose: 93 mg/dL (ref 70–99)
Potassium: 4.4 mmol/L (ref 3.5–5.2)
Sodium: 137 mmol/L (ref 134–144)
Total Protein: 7.6 g/dL (ref 6.0–8.5)
eGFR: 84 mL/min/{1.73_m2} (ref >59)

## 2021-09-02 LAB — LIPID PANEL
Chol/HDL Ratio: 3.7 ratio (ref 0.0–5.0)
Cholesterol, Total: 179 mg/dL (ref 100–199)
HDL: 49 mg/dL (ref >39)
LDL Chol Calc (NIH): 101 mg/dL — ABNORMAL HIGH (ref 0–99)
Triglycerides: 168 mg/dL — ABNORMAL HIGH (ref 0–149)
VLDL Cholesterol Cal: 29 mg/dL (ref 5–40)

## 2021-09-03 ENCOUNTER — Encounter: Payer: Self-pay | Admitting: Family Medicine

## 2021-09-03 NOTE — Progress Notes (Signed)
Letter sent The albumin to creatinine ratio test that we checked, is a little more elevated than it was last year.  I do not think there is anything we need to do specifically.  You are on the appropriate medicines for your blood pressure that will continue to add protection to the kidney.  I would recommend we recheck the lab test in 1 year.  If you become worried about it, of course we could always check it in 6 months but I do not think that is necessary.  The ideal treatment is to continue to get really good blood pressure control which we have achieved.  The other test that we used to look at your kidney function, the creatinine, is 1.07 which looks very good.  If you remember, you had gotten as high as 1.4 at 1 point and this is very reassuring.  I would continue to check this lab every year as I do anybody who has high blood pressure.  The rest of your labs look good as well!  Your triglycerides are up just a tiny bit but that probably has more to do with time of day of the test than anything else.  That is still a good number.  Your LDL looks wonderful.  All in all very good report.

## 2021-12-14 ENCOUNTER — Encounter: Payer: Self-pay | Admitting: *Deleted

## 2022-04-23 ENCOUNTER — Emergency Department (HOSPITAL_COMMUNITY)
Admission: EM | Admit: 2022-04-23 | Discharge: 2022-04-24 | Disposition: A | Discharge status: Home / Self Care | Payer: BC Managed Care – PPO | Attending: Student | Admitting: Student

## 2022-04-23 ENCOUNTER — Other Ambulatory Visit: Payer: Self-pay

## 2022-04-23 ENCOUNTER — Encounter (HOSPITAL_COMMUNITY): Payer: Self-pay

## 2022-04-23 DIAGNOSIS — S41112A Laceration without foreign body of left upper arm, initial encounter: Secondary | ICD-10-CM | POA: Diagnosis not present

## 2022-04-23 DIAGNOSIS — Y93E5 Activity, floor mopping and cleaning: Secondary | ICD-10-CM | POA: Diagnosis not present

## 2022-04-23 DIAGNOSIS — S59912A Unspecified injury of left forearm, initial encounter: Secondary | ICD-10-CM | POA: Diagnosis not present

## 2022-04-23 DIAGNOSIS — W268XXA Contact with other sharp object(s), not elsewhere classified, initial encounter: Secondary | ICD-10-CM | POA: Diagnosis not present

## 2022-04-23 DIAGNOSIS — Z79899 Other long term (current) drug therapy: Secondary | ICD-10-CM | POA: Insufficient documentation

## 2022-04-23 DIAGNOSIS — S51812A Laceration without foreign body of left forearm, initial encounter: Secondary | ICD-10-CM | POA: Insufficient documentation

## 2022-04-23 DIAGNOSIS — I10 Essential (primary) hypertension: Secondary | ICD-10-CM | POA: Insufficient documentation

## 2022-04-23 NOTE — ED Triage Notes (Signed)
Pt to ED POV from home. Pt was cleaning bathtub and cut left arm on glass door tracks. Arm is bandaged, no hemorrhaging noted. Pt A&Ox4, NAD noted.

## 2022-04-24 MED ORDER — LIDOCAINE-EPINEPHRINE (PF) 2 %-1:200000 IJ SOLN
10.0000 mL | Freq: Once | INTRAMUSCULAR | Status: DC
  Filled 2022-04-24: qty 20

## 2022-04-24 NOTE — ED Provider Triage Note (Signed)
Emergency Medicine Provider Triage Evaluation Note  Paul Henderson , a 51 y.o. male  was evaluated in triage.  Pt complains of laceration to the left medial forearm sustained after brushing up against a piece of metal in his shower and he was cleaning.  Scant bleeding at this time but gaping.. Last tetanus 2 years ago Review of Systems  Positive: As above Negative: As above  Physical Exam  BP 114/77   Pulse (!) 101   Temp 98.3 F (36.8 C)   Resp 15   SpO2 95%  Gen:   Awake, no distress   Resp:  Normal effort  MSK:   Moves extremities without difficulty  Other:  3 cm gaping wound to the mid medial forearm, hemostatic at this time with subcu tissue exposure but no exposure of the muscle belly.  Medical Decision Making  Medically screening exam initiated at 12:05 AM.  Appropriate orders placed.  Hortencia Conradi was informed that the remainder of the evaluation will be completed by another provider, this initial triage assessment does not replace that evaluation, and the importance of remaining in the ED until their evaluation is complete.  This chart was dictated using voice recognition software, Dragon. Despite the best efforts of this provider to proofread and correct errors, errors may still occur which can change documentation meaning.    Emeline Darling, PA-C 04/24/22 0013

## 2022-04-24 NOTE — Discharge Instructions (Signed)
You were seen in the Today for your wound.  This was repaired with sutures in the ER.  You have 5 stitches in your arm.  They should be removed in 7 to 10 days.  They present to the ER/urgent care/your primary care doctor for suture removal.  Please keep antibiotic ointment on the area daily and keep it clean and dry.  Return to the ER for any redness, swelling, has not drainage from the area, or any other new severe symptoms

## 2022-04-24 NOTE — ED Provider Notes (Signed)
[facility] EMERGENCY DEPARTMENT Provider Note   CSN: [id number] Arrival date & time: 04/23/22  2131     History  Chief Complaint  Patient presents with   Laceration    Paul Henderson is a 51 y.o. male who presents to the ER for laceration to left forearm sustained when he was cleaning the bathtub.  States that he was reaching through the door frame to clean the shower when he sliced his arm on the metal track from the shower door.  Blood initially, irrigated at home, dressed in triage without obvious hemorrhage.  I personally reviewed his medical records.  Patient states last tetanus within the last 2 years.  History of hypertension hyperlipidemia.  HPI     Home Medications Prior to Admission medications   Medication Sig Start Date End Date Taking? Authorizing Provider  amLODipine (NORVASC) 5 MG tablet Take 1 tablet (5 mg total) by mouth daily. 09/01/21   Dickie La, MD  Candesartan Cilexetil-HCTZ 32-25 MG TABS Take 1 tablet by mouth daily. 09/01/21   Dickie La, MD  fluticasone (FLONASE) 50 MCG/ACT nasal spray Place 2 sprays into both nostrils daily. 09/01/21   Dickie La, MD      Allergies    Patient has no known allergies.    Review of Systems   Review of Systems  Skin:  Positive for wound.    Physical Exam Updated Vital Signs BP 114/77   Pulse (!) 101   Temp 98.3 F (36.8 C)   Resp 15   SpO2 95%  Physical Exam Vitals and nursing note reviewed.  Constitutional:      Appearance: He is not ill-appearing or toxic-appearing.  HENT:     Head: Normocephalic and atraumatic.  Eyes:     General: No scleral icterus.       Right eye: No discharge.        Left eye: No discharge.     Conjunctiva/sclera: Conjunctivae normal.  Pulmonary:     Effort: Pulmonary effort is normal.  Musculoskeletal:       Arms:     Comments: 2+ radial pulses  Skin:    General: Skin is warm and dry.  Neurological:     General: No focal deficit present.      Mental Status: He is alert.  Psychiatric:        Mood and Affect: Mood normal.     ED Results / Procedures / Treatments   Labs (all labs ordered are listed, but only abnormal results are displayed) Labs Reviewed - No data to display  EKG None  Radiology No results found.  Procedures .Marland KitchenLaceration Repair  Date/Time: 04/24/2022 3:14 AM  Performed by: Emeline Darling, PA-C Authorized by: Emeline Darling, PA-C   Consent:    Consent obtained:  Verbal   Consent given by:  Patient   Risks discussed:  Infection, need for additional repair, pain, poor cosmetic result and poor wound healing   Alternatives discussed:  No treatment and delayed treatment Universal protocol:    Procedure explained and questions answered to patient or proxy's satisfaction: yes     Relevant documents present and verified: yes     Test results available: yes     Imaging studies available: yes     Required blood products, implants, devices, and special equipment available: yes     Site/side marked: yes     Immediately prior to procedure, a time out was called: yes  Patient identity confirmed:  Verbally with patient Anesthesia:    Anesthesia method:  Local infiltration   Local anesthetic:  Lidocaine 2% WITH epi Laceration details:    Location:  Shoulder/arm   Shoulder/arm location:  L lower arm   Length (cm):  3 Exploration:    Limited defect created (wound extended): no     Wound exploration: wound explored through full range of motion and entire depth of wound visualized     Wound extent: no foreign bodies/material noted, no tendon damage noted and no underlying fracture noted     Contaminated: no   Treatment:    Area cleansed with:  Betadine, Shur-Clens and saline   Amount of cleaning:  Standard   Irrigation solution:  Sterile saline Skin repair:    Repair method:  Sutures   Suture size:  4-0   Suture material:  Prolene   Suture technique:  Simple interrupted   Number of  sutures:  5 Approximation:    Approximation:  Close Post-procedure details:    Dressing:  Antibiotic ointment and non-adherent dressing   Procedure completion:  Tolerated well, no immediate complications     Medications Ordered in ED Medications  lidocaine-EPINEPHrine (XYLOCAINE W/EPI) 2 %-1:200000 (PF) injection 10 mL (has no administration in time range)    ED Course/ Medical Decision Making/ A&P                           Medical Decision Making 51 year old male with laceration to left arm as above.  Mildly tachycardic on intake, vitals otherwise normal.  Cardiopulmonary abdominal exam is benign.  Patient is neurovascular tact in all extremities.  3 cm laceration to left medial forearm.  Without evidence of contamination or foreign body.  Risk Prescription drug management.   Wound repaired as above.  Hemostatic after repair with normal neurovascular status distal to the injury.  Dressed with antibiotic ointment by this provider.  No further work-up warranted in the ED at this time.  Laceration care instructions provided to the patient and his wife.  Paul Henderson and his wife  voiced understanding of her medical evaluation and treatment plan. Each of their questions answered to their expressed satisfaction.  Return precautions were given.  Patient is well-appearing, stable, and was discharged in good condition.  This chart was dictated using voice recognition software, Dragon. Despite the best efforts of this provider to proofread and correct errors, errors may still occur which can change documentation meaning.   Final Clinical Impression(s) / ED Diagnoses Final diagnoses:  Laceration of left upper extremity, initial encounter    Rx / DC Orders ED Discharge Orders     None         Emeline Darling, PA-C 04/24/22 0319    Orpah Greek, MD 04/24/22 [phone]

## 2022-05-05 ENCOUNTER — Ambulatory Visit
Admission: EM | Admit: 2022-05-05 | Discharge: 2022-05-05 | Disposition: A | Discharge status: Home / Self Care | Payer: BC Managed Care – PPO

## 2022-05-05 DIAGNOSIS — Z4802 Encounter for removal of sutures: Secondary | ICD-10-CM

## 2022-05-05 DIAGNOSIS — S41112D Laceration without foreign body of left upper arm, subsequent encounter: Secondary | ICD-10-CM

## 2022-05-05 NOTE — ED Provider Notes (Addendum)
[facility]    CSN: [id number] Arrival date & time: 05/05/22  1338      History   Chief Complaint Chief Complaint  Patient presents with   Suture / Staple Removal    HPI Paul Henderson is a 51 y.o. male.   Patient presents for visit for suture removal of laceration to left forearm.  Patient presented to the ED on 04/23/2022 after laceration to the forearm.  He had 5 sutures placed.  He denies that he has had any increased pain, swelling, redness, purulent drainage.  Denies any numbness or tingling.  Patient reports full range of motion of upper extremity.   Suture / Staple Removal    No past medical history on file.  Patient Active Problem List   Diagnosis Date Noted   Healthcare maintenance 09/01/2021   Hyperlipidemia 09/05/2019   Need for immunization against influenza 09/05/2019   Elevated serum creatinine 09/05/2019   Obesity 06/08/2015   Well adult health check 06/20/2013   Essential hypertension 09/07/2006    No past surgical history on file.     Home Medications    Prior to Admission medications   Medication Sig Start Date End Date Taking? Authorizing Provider  amLODipine (NORVASC) 5 MG tablet Take 1 tablet (5 mg total) by mouth daily. 09/01/21   Dickie La, MD  Candesartan Cilexetil-HCTZ 32-25 MG TABS Take 1 tablet by mouth daily. 09/01/21   Dickie La, MD  fluticasone (FLONASE) 50 MCG/ACT nasal spray Place 2 sprays into both nostrils daily. 09/01/21   Dickie La, MD    Family History No family history on file.  Social History Social History   Tobacco Use   Smoking status: Never   Smokeless tobacco: Never  Vaping Use   Vaping Use: Never used  Substance Use Topics   Alcohol use: No   Drug use: No     Allergies   Patient has no known allergies.   Review of Systems Review of Systems Per HPI  Physical Exam Triage Vital Signs ED Triage Vitals [05/05/22 1408]  Enc Vitals Group     BP      Pulse      Resp       Temp      Temp src      SpO2      Weight      Height      Head Circumference      Peak Flow      Pain Score 0     Pain Loc      Pain Edu?      Excl. in [place]?    No data found.  Updated Vital Signs There were no vitals taken for this visit.  Visual Acuity Right Eye Distance:   Left Eye Distance:   Bilateral Distance:    Right Eye Near:   Left Eye Near:    Bilateral Near:     Physical Exam Constitutional:      General: He is not in acute distress.    Appearance: Normal appearance. He is not toxic-appearing or diaphoretic.  HENT:     Head: Normocephalic and atraumatic.  Eyes:     Extraocular Movements: Extraocular movements intact.     Conjunctiva/sclera: Conjunctivae normal.  Pulmonary:     Effort: Pulmonary effort is normal.  Skin:    Comments: Patient has approximately 3 inch linear laceration present to left forearm that is healing adequately.  5 sutures are currently in place.  No obvious signs of infection.  Patient is neurovascularly intact and has full range of motion.  Neurological:     General: No focal deficit present.     Mental Status: He is alert and oriented to person, place, and time. Mental status is at baseline.  Psychiatric:        Mood and Affect: Mood normal.        Behavior: Behavior normal.        Thought Content: Thought content normal.        Judgment: Judgment normal.      UC Treatments / Results  Labs (all labs ordered are listed, but only abnormal results are displayed) Labs Reviewed - No data to display  EKG   Radiology No results found.  Procedures Procedures (including critical care time)  Medications Ordered in UC Medications - No data to display  Initial Impression / Assessment and Plan / UC Course  I have reviewed the triage vital signs and the nursing notes.  Pertinent labs & imaging results that were available during my care of the patient were reviewed by me and considered in my medical decision making (see chart  for details).     Clinical staff removed sutures with no complications.  Laceration is healing adequately with no signs of infection.  Advised patient to continue to monitor for any increased signs of infection and to follow-up if they occur.  Advised of wound care as well. Vital signs not completed since this is coded as a nursing visit. Patient appears stable.   Discussed strict return precautions.  Patient verbalized understanding and was agreeable with plan. Final Clinical Impressions(s) / UC Diagnoses   Final diagnoses:  Visit for suture removal  Laceration of left upper extremity, subsequent encounter   Discharge Instructions   None    ED Prescriptions   None    PDMP not reviewed this encounter.   Teodora Medici, [place] 05/05/22 [place], [place], [employer] 05/05/22 1429

## 2022-05-05 NOTE — ED Triage Notes (Signed)
Pt is present today for suture removal on the left forearm

## 2022-05-30 ENCOUNTER — Encounter: Payer: Self-pay | Admitting: Gastroenterology

## 2022-06-15 ENCOUNTER — Ambulatory Visit (AMBULATORY_SURGERY_CENTER): Payer: BC Managed Care – PPO | Admitting: *Deleted

## 2022-06-15 VITALS — Ht 70.0 in | Wt 250.4 lb

## 2022-06-15 DIAGNOSIS — Z1211 Encounter for screening for malignant neoplasm of colon: Secondary | ICD-10-CM

## 2022-06-15 MED ORDER — NA SULFATE-K SULFATE-MG SULF 17.5-3.13-1.6 GM/177ML PO SOLN: 1.0000 | Freq: Once | ORAL | 0 refills | Status: AC

## 2022-06-15 NOTE — Progress Notes (Signed)
No egg or soy allergy known to patient  No issues known to pt with past sedation with any surgeries or procedures Patient denies ever being told they had issues or difficulty with intubation  No FH of Malignant Hyperthermia Pt is not on diet pills Pt is not on  home 02  Pt is not on blood thinners  Pt denies issues with constipation  No A fib or A flutter Have any cardiac testing pending--NO Pt instructed to use Single[employer] or GoodRx for a price reduction on prep    Pt. Wanted prep sent to [facility].

## 2022-07-05 ENCOUNTER — Encounter: Payer: Self-pay | Admitting: Gastroenterology

## 2022-07-07 ENCOUNTER — Encounter: Payer: Self-pay | Admitting: Gastroenterology

## 2022-07-07 ENCOUNTER — Ambulatory Visit (AMBULATORY_SURGERY_CENTER): Payer: BC Managed Care – PPO | Admitting: Gastroenterology

## 2022-07-07 VITALS — BP 99/58 | HR 88 | Resp 19

## 2022-07-07 DIAGNOSIS — D123 Benign neoplasm of transverse colon: Secondary | ICD-10-CM

## 2022-07-07 DIAGNOSIS — Z1211 Encounter for screening for malignant neoplasm of colon: Secondary | ICD-10-CM | POA: Diagnosis not present

## 2022-07-07 DIAGNOSIS — D124 Benign neoplasm of descending colon: Secondary | ICD-10-CM

## 2022-07-07 MED ORDER — SODIUM CHLORIDE 0.9 % IV SOLN: 500.0000 mL | Freq: Once | INTRAVENOUS | Status: DC

## 2022-07-07 NOTE — Progress Notes (Signed)
Called to room to assist during endoscopic procedure.  Patient ID and intended procedure confirmed with present staff. Received instructions for my participation in the procedure from the performing physician.  

## 2022-07-07 NOTE — Progress Notes (Signed)
[facility] History and Physical   Primary Care Physician:  Dickie La, MD   Reason for Procedure:   Colon cancer screening  Plan:    Screening colonoscopy     HPI: Paul Henderson is a 51 y.o. male undergoing initial average risk screening colonoscopy.  He has no family history of colon cancer and no chronic GI symptoms.    Past Medical History:  Diagnosis Date   Hypertension     Past Surgical History:  Procedure Laterality Date   NO SURGERIES     UPDATED 06/15/22    Prior to Admission medications   Medication Sig Start Date End Date Taking? Authorizing Provider  amLODipine (NORVASC) 5 MG tablet Take 1 tablet (5 mg total) by mouth daily. 09/01/21  Yes Dickie La, MD  Candesartan Cilexetil-HCTZ 32-25 MG TABS Take 1 tablet by mouth daily. 09/01/21  Yes Dickie La, MD  fluticasone (FLONASE) 50 MCG/ACT nasal spray Place 2 sprays into both nostrils daily. Patient taking differently: Place 2 sprays into both nostrils as needed. 09/01/21   Dickie La, MD    Current Outpatient Medications  Medication Sig Dispense Refill   amLODipine (NORVASC) 5 MG tablet Take 1 tablet (5 mg total) by mouth daily. 90 tablet 3   Candesartan Cilexetil-HCTZ 32-25 MG TABS Take 1 tablet by mouth daily. 90 tablet 3   fluticasone (FLONASE) 50 MCG/ACT nasal spray Place 2 sprays into both nostrils daily. (Patient taking differently: Place 2 sprays into both nostrils as needed.) 48 g 3   Current Facility-Administered Medications  Medication Dose Route Frequency Provider Last Rate Last Admin   0.9 %  sodium chloride infusion  500 mL Intravenous Once Daryel November, MD        Allergies as of 07/07/2022   (No Known Allergies)    Family History  Problem Relation Age of Onset   Colon cancer Neg Hx    Colon polyps Neg Hx    Crohn's disease Neg Hx    Esophageal cancer Neg Hx    Rectal cancer Neg Hx    Stomach cancer Neg Hx    Ulcerative colitis Neg Hx     Social History    Socioeconomic History   Marital status: Married    Spouse name: Not on file   Number of children: Not on file   Years of education: Not on file   Highest education level: Not on file  Occupational History   Not on file  Tobacco Use   Smoking status: Never    Passive exposure: Never   Smokeless tobacco: Never  Vaping Use   Vaping Use: Never used  Substance and Sexual Activity   Alcohol use: No   Drug use: No   Sexual activity: Not on file  Other Topics Concern   Not on file  Social History Narrative   Works for [employer], truck Surveyor, quantity Determinants of Radio broadcast assistant Strain: Not on file  Food Insecurity: Not on file  Transportation Needs: Not on file  Physical Activity: Not on file  Stress: Not on file  Social Connections: Not on file  Intimate Partner Violence: Not on file    Review of Systems:  All other review of systems negative except as mentioned in the HPI.  Physical Exam: Vital signs There were no vitals taken for this visit.  General:   Alert,  Well-developed, well-nourished, pleasant and cooperative in NAD Airway:  Mallampati 3 Lungs:  Clear throughout  to auscultation.   Heart:  Regular rate and rhythm; no murmurs, clicks, rubs,  or gallops. Abdomen:  Soft, nontender and nondistended. Normal bowel sounds.   Neuro/Psych:  Normal mood and affect. A and O x 3   Paul Mcneil E. Candis Schatz, MD [facility]

## 2022-07-07 NOTE — Op Note (Signed)
Paul Henderson Patient Name: Paul Henderson Procedure Date: 07/07/2022 12:21 PM MRN: [id number] Endoscopist: Nicki Reaper E. Candis Schatz , MD, [id number] Age: 51 Referring MD:  Date of Birth: Dec 27, 1970 Gender: Male Account #: [id number] Procedure:                Colonoscopy Indications:              Screening for colorectal malignant neoplasm, This                            is the patient's first colonoscopy Medicines:                Monitored Anesthesia Care Procedure:                Pre-Anesthesia Assessment:                           - Prior to the procedure, a History and Physical                            was performed, and patient medications and                            allergies were reviewed. The patient's tolerance of                            previous anesthesia was also reviewed. The risks                            and benefits of the procedure and the sedation                            options and risks were discussed with the patient.                            All questions were answered, and informed consent                            was obtained. Prior Anticoagulants: The patient has                            taken no anticoagulant or antiplatelet agents. ASA                            Grade Assessment: II - A patient with mild systemic                            disease. After reviewing the risks and benefits,                            the patient was deemed in satisfactory condition to                            undergo the procedure.  After obtaining informed consent, the colonoscope                            was passed under direct vision. Throughout the                            procedure, the patient's blood pressure, pulse, and                            oxygen saturations were monitored continuously. The                            CF [id number] #[id number] was introduced through the anus                            and  advanced to the the terminal ileum, with                            identification of the appendiceal orifice and IC                            valve. The colonoscopy was performed without                            difficulty. The patient tolerated the procedure                            well. The quality of the bowel preparation was                            excellent. The terminal ileum, ileocecal valve,                            appendiceal orifice, and rectum were photographed.                            The bowel preparation used was SUPREP via split                            dose instruction. Scope In: 1:25:00 PM Scope Out: 1:41:31 PM Scope Withdrawal Time: 0 hours 13 minutes 56 seconds  Total Procedure Duration: 0 hours 16 minutes 31 seconds  Findings:                 The perianal and digital rectal examinations were                            normal. Pertinent negatives include normal                            sphincter tone and no palpable rectal lesions.                           Two sessile polyps were found in the transverse  colon. The polyps were 3 to 4 mm in size. These                            polyps were removed with a cold snare. Resection                            and retrieval were complete. Estimated blood loss                            was minimal.                           A 3 mm polyp was found in the descending colon. The                            polyp was sessile. The polyp was removed with a                            cold snare. Resection and retrieval were complete.                            Estimated blood loss was minimal.                           The exam was otherwise normal throughout the                            examined colon.                           The terminal ileum appeared normal.                           The retroflexed view of the distal rectum and anal                            verge was normal and  showed no anal or rectal                            abnormalities. Complications:            No immediate complications. Estimated Blood Loss:     Estimated blood loss was minimal. Impression:               - Two 3 to 4 mm polyps in the transverse colon,                            removed with a cold snare. Resected and retrieved.                           - One 3 mm polyp in the descending colon, removed                            with a cold snare. Resected and retrieved.                           -  The examined portion of the ileum was normal.                           - The distal rectum and anal verge are normal on                            retroflexion view. Recommendation:           - Patient has a contact number available for                            emergencies. The signs and symptoms of potential                            delayed complications were discussed with the                            patient. Return to normal activities tomorrow.                            Written discharge instructions were provided to the                            patient.                           - Resume previous diet.                           - Continue present medications.                           - Await pathology results.                           - Repeat colonoscopy (date not yet determined) for                            surveillance based on pathology results. Paul Henderson E. Candis Schatz, MD 07/07/2022 1:46:59 PM This report has been signed electronically.

## 2022-07-07 NOTE — Progress Notes (Signed)
Report to PACU, RN, vss, BBS= Clear.  

## 2022-07-07 NOTE — Progress Notes (Signed)
Pt's states no medical or surgical changes since previsit or office visit. VS assessed by D.T 

## 2022-07-07 NOTE — Patient Instructions (Signed)
Handout on polyps given to patient. Await pathology results. Resume previous diet and continue present medications.  Repeat colonoscopy for surveillance will be determined based off of pathology results.   YOU HAD AN ENDOSCOPIC PROCEDURE TODAY AT THE [facility]:   Refer to the procedure report that was given to you for any specific questions about what was found during the examination.  If the procedure report does not answer your questions, please call your gastroenterologist to clarify.  If you requested that your care partner not be given the details of your procedure findings, then the procedure report has been included in a sealed envelope for you to review at your convenience later.  YOU SHOULD EXPECT: Some feelings of bloating in the abdomen. Passage of more gas than usual.  Walking can help get rid of the air that was put into your GI tract during the procedure and reduce the bloating. If you had a lower endoscopy (such as a colonoscopy or flexible sigmoidoscopy) you may notice spotting of blood in your stool or on the toilet paper. If you underwent a bowel prep for your procedure, you may not have a normal bowel movement for a few days.  Please Note:  You might notice some irritation and congestion in your nose or some drainage.  This is from the oxygen used during your procedure.  There is no need for concern and it should clear up in a day or so.  SYMPTOMS TO REPORT IMMEDIATELY:  Following lower endoscopy (colonoscopy or flexible sigmoidoscopy):  Excessive amounts of blood in the stool  Significant tenderness or worsening of abdominal pains  Swelling of the abdomen that is new, acute  Fever of 100F or higher  For urgent or emergent issues, a gastroenterologist can be reached at any hour by calling [phone]. Do not use MyChart messaging for urgent concerns.    DIET:  We do recommend a small meal at first, but then you may proceed to your regular diet.  Drink  plenty of fluids but you should avoid alcoholic beverages for 24 hours.  ACTIVITY:  You should plan to take it easy for the rest of today and you should NOT DRIVE or use heavy machinery until tomorrow (because of the sedation medicines used during the test).    FOLLOW UP: Our staff will call the number listed on your records the next business day following your procedure.  We will call around 7:15- 8:00 am to check on you and address any questions or concerns that you may have regarding the information given to you following your procedure. If we do not reach you, we will leave a message.     If any biopsies were taken you will be contacted by phone or by letter within the next 1-3 weeks.  Please call us at [phone] if you have not heard about the biopsies in 3 weeks.    SIGNATURES/CONFIDENTIALITY: You and/or your care partner have signed paperwork which will be entered into your electronic medical record.  These signatures attest to the fact that that the information above on your After Visit Summary has been reviewed and is understood.  Full responsibility of the confidentiality of this discharge information lies with you and/or your care-partner. 

## 2022-07-08 ENCOUNTER — Telehealth: Payer: Self-pay

## 2022-07-08 NOTE — Telephone Encounter (Signed)
  Follow up Call-     07/07/2022   12:44 PM  Call back number  Post procedure Call Back phone  # [phone]  Permission to leave phone message Yes     Patient questions:  Do you have a fever, pain , or abdominal swelling? No. Pain Score  0 *  Have you tolerated food without any problems? Yes.    Have you been able to return to your normal activities? Yes.    Do you have any questions about your discharge instructions: Diet   No. Medications  No. Follow up visit  No.  Do you have questions or concerns about your Care? No.  Actions: * If pain score is 4 or above: No action needed, pain <4.

## 2022-07-16 NOTE — Progress Notes (Signed)
Mr. Paul Henderson,   The three polyps that I removed during your recent procedure were completely benign but were proven to be "pre-cancerous" polyps that MAY have grown into cancers if they had not been removed.  Studies shows that at least 20% of women over age 52 and 30% of men over age 30 have pre-cancerous polyps.  Based on current nationally recognized surveillance guidelines, I recommend that you have a repeat colonoscopy in 5 years.   If you develop any new rectal bleeding, abdominal pain or significant bowel habit changes, please contact me before then.

## 2022-09-02 ENCOUNTER — Other Ambulatory Visit: Payer: Self-pay | Admitting: Family Medicine

## 2022-09-02 DIAGNOSIS — I10 Essential (primary) hypertension: Secondary | ICD-10-CM

## 2022-09-03 ENCOUNTER — Other Ambulatory Visit: Payer: Self-pay | Admitting: Family Medicine

## 2022-09-03 DIAGNOSIS — I10 Essential (primary) hypertension: Secondary | ICD-10-CM

## 2022-09-05 ENCOUNTER — Other Ambulatory Visit: Payer: Self-pay

## 2022-09-05 DIAGNOSIS — I10 Essential (primary) hypertension: Secondary | ICD-10-CM

## 2022-09-05 NOTE — Telephone Encounter (Signed)
See message from 09/03/2022.  E-prescribing error.  Ottis Stain, CMA

## 2022-09-06 MED ORDER — CANDESARTAN CILEXETIL-HCTZ 32-25 MG PO TABS: 1.0000 | ORAL_TABLET | Freq: Every day | ORAL | 0 refills | Status: DC

## 2022-10-12 ENCOUNTER — Encounter: Payer: Self-pay | Admitting: Family Medicine

## 2022-10-12 ENCOUNTER — Ambulatory Visit: Payer: BC Managed Care – PPO | Admitting: Family Medicine

## 2022-10-12 VITALS — BP 130/72 | HR 77 | Ht 71.0 in | Wt 255.6 lb

## 2022-10-12 DIAGNOSIS — I10 Essential (primary) hypertension: Secondary | ICD-10-CM

## 2022-10-12 DIAGNOSIS — R319 Hematuria, unspecified: Secondary | ICD-10-CM | POA: Diagnosis not present

## 2022-10-12 DIAGNOSIS — Z125 Encounter for screening for malignant neoplasm of prostate: Secondary | ICD-10-CM

## 2022-10-12 DIAGNOSIS — E785 Hyperlipidemia, unspecified: Secondary | ICD-10-CM

## 2022-10-12 DIAGNOSIS — Z0000 Encounter for general adult medical examination without abnormal findings: Secondary | ICD-10-CM

## 2022-10-12 DIAGNOSIS — R7989 Other specified abnormal findings of blood chemistry: Secondary | ICD-10-CM

## 2022-10-12 MED ORDER — AMLODIPINE BESYLATE 5 MG PO TABS: 5.0000 mg | ORAL_TABLET | Freq: Every day | ORAL | 3 refills | Status: DC

## 2022-10-12 MED ORDER — FLUTICASONE PROPIONATE 50 MCG/ACT NA SUSP: 2.0000 | Freq: Every day | NASAL | 3 refills | Status: DC

## 2022-10-12 MED ORDER — CANDESARTAN CILEXETIL-HCTZ 32-25 MG PO TABS: 1.0000 | ORAL_TABLET | Freq: Every day | ORAL | 3 refills | Status: DC

## 2022-10-12 MED ORDER — CANDESARTAN CILEXETIL-HCTZ 32-25 MG PO TABS
1.0000 | ORAL_TABLET | Freq: Every day | ORAL | 3 refills | Status: DC
Start: 2022-10-12 — End: 2022-10-12

## 2022-10-12 MED ORDER — FLUTICASONE PROPIONATE 50 MCG/ACT NA SUSP: 2.0000 | Freq: Every day | NASAL | 3 refills | Status: AC

## 2022-10-12 NOTE — Assessment & Plan Note (Signed)
Will check UA today as well as his PSA.  He tells me that he has had history of hematuria.  I do not see any notes from urology and he cannot remember if we had seen him in the past or not.  Will start with a UA.  If he does have it hematuria then I think we need to refer her along to urology.  He is also had elevated creatinine and will check today.

## 2022-10-12 NOTE — Progress Notes (Signed)
    CHIEF COMPLAINT / HPI: Check up on a coup[le of issues Just had his DOT physical and passed.  Having some cramping in his hands.  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  Grandma usually is in the morning and when he first starts driving then it gets little better.  She occasionally had a cramp in his leg but not on his feet.  No numbness or tingling.  No loss of sensation.  This has been going on about a month.  Cramping areas in the fingers mostly. Has some questions about screening.  Recently had his colonoscopy. Taking his blood pressure medicine regularly without problem.  He did wonder if the cramping in his hands was related to one of the blood pressure medicines.   PERTINENT  PMH / PSH: I have reviewed the patient's medications, allergies, past medical and surgical history, smoking status and updated in the EMR as appropriate.   OBJECTIVE:  BP 130/72   Pulse 77   Ht 5\' 11"  (1.803 m)   Wt 255 lb 9.6 oz (115.9 kg)   SpO2 100%   BMI 35.65 kg/m  Vital signs reviewed. GENERAL: Well-developed, well-nourished, no acute distress. CARDIOVASCULAR: Regular rate and rhythm no murmur gallop or rub LUNGS: Clear to auscultation bilaterally, no rales or wheeze. ABDOMEN: Soft positive bowel sounds NEURO: No gross focal neurological deficits.  Soft touch sensation and pinprick sensation was 2 point discrimination is intact and normal bilaterally in his hands and fingers. MSK: Movement of extremity x 4. VASCULAR: Radial pulses 2+ bilaterally symmetrical.  Normal cap refill in his hands.  Dorsalis pedis pulses 2+ bilaterally are symmetrical. PSYCH: AxOx4. Good eye contact.. No psychomotor retardation or agitation. Appropriate speech fluency and content. Asks and answers questions appropriately. Mood is congruent.    ASSESSMENT / PLAN: He is doing a great job of updating his health maintenance and starting to take care of himself well.  Discussed getting annual eye  exam.  Essential hypertension Repeat blood pressure today was normal.  I think he was a little nervous about getting his physical.  Will continue current medications.  Check labs today.  Hyperlipidemia Check lipid profile today  Hematuria Will check UA today as well as his PSA.  He tells me that he has had history of hematuria.  I do not see any notes from urology and he cannot remember if we had seen him in the past or not.  Will start with a UA.  If he does have it hematuria then I think we need to refer her along to urology.  He is also had elevated creatinine and will check today.   [name] MD

## 2022-10-12 NOTE — Assessment & Plan Note (Deleted)
He is doing a great job of updating his health maintenance and starting to take care of himself well.  Discussed getting annual eye exam.

## 2022-10-12 NOTE — Patient Instructions (Signed)
Great to see you! I will send you a note about your labs. 

## 2022-10-12 NOTE — Assessment & Plan Note (Signed)
Repeat blood pressure today was normal.  I think he was a little nervous about getting his physical.  Will continue current medications.  Check labs today.

## 2022-10-12 NOTE — Assessment & Plan Note (Signed)
Check lipid profile today. 

## 2022-10-13 LAB — MICROSCOPIC EXAMINATION
Bacteria, UA: NONE SEEN
Casts: NONE SEEN /lpf
Epithelial Cells (non renal): NONE SEEN /hpf (ref 0–10)
WBC, UA: NONE SEEN /hpf (ref 0–5)

## 2022-10-13 LAB — MICROALBUMIN / CREATININE URINE RATIO
Creatinine, Urine: 105.3 mg/dL
Microalb/Creat Ratio: 183 mg/g creat — ABNORMAL HIGH (ref 0–29)
Microalbumin, Urine: 192.4 ug/mL

## 2022-10-13 LAB — UA/M W/RFLX CULTURE, ROUTINE
Bilirubin, UA: NEGATIVE
Glucose, UA: NEGATIVE
Ketones, UA: NEGATIVE
Leukocytes,UA: NEGATIVE
Nitrite, UA: NEGATIVE
Specific Gravity, UA: 1.017 (ref 1.005–1.030)
Urobilinogen, Ur: 0.2 mg/dL (ref 0.2–1.0)
pH, UA: 6.5 (ref 5.0–7.5)

## 2022-10-13 LAB — COMPREHENSIVE METABOLIC PANEL
ALT: 39 IU/L (ref 0–44)
AST: 40 IU/L (ref 0–40)
Albumin/Globulin Ratio: 1.2 (ref 1.2–2.2)
Albumin: 4.2 g/dL (ref 3.8–4.9)
Alkaline Phosphatase: 82 IU/L (ref 44–121)
BUN/Creatinine Ratio: 15 (ref 9–20)
BUN: 15 mg/dL (ref 6–24)
Bilirubin Total: 0.4 mg/dL (ref 0.0–1.2)
CO2: 21 mmol/L (ref 20–29)
Calcium: 10.1 mg/dL (ref 8.7–10.2)
Chloride: 98 mmol/L (ref 96–106)
Creatinine, Ser: 1 mg/dL (ref 0.76–1.27)
Globulin, Total: 3.4 g/dL (ref 1.5–4.5)
Glucose: 73 mg/dL (ref 70–99)
Potassium: 4.8 mmol/L (ref 3.5–5.2)
Sodium: 137 mmol/L (ref 134–144)
Total Protein: 7.6 g/dL (ref 6.0–8.5)
eGFR: 91 mL/min/{1.73_m2} (ref >59)

## 2022-10-13 LAB — PSA: Prostate Specific Ag, Serum: 0.3 ng/mL (ref 0.0–4.0)

## 2022-10-13 LAB — LIPID PANEL
Chol/HDL Ratio: 3.6 ratio (ref 0.0–5.0)
Cholesterol, Total: 185 mg/dL (ref 100–199)
HDL: 51 mg/dL (ref >39)
LDL Chol Calc (NIH): 99 mg/dL (ref 0–99)
Triglycerides: 207 mg/dL — ABNORMAL HIGH (ref 0–149)
VLDL Cholesterol Cal: 35 mg/dL (ref 5–40)

## 2022-10-14 ENCOUNTER — Other Ambulatory Visit: Payer: Self-pay | Admitting: Family Medicine

## 2022-10-14 ENCOUNTER — Encounter: Payer: Self-pay | Admitting: Family Medicine

## 2022-10-14 DIAGNOSIS — R809 Proteinuria, unspecified: Secondary | ICD-10-CM

## 2022-10-14 DIAGNOSIS — R3121 Asymptomatic microscopic hematuria: Secondary | ICD-10-CM

## 2022-10-14 NOTE — Progress Notes (Signed)
Labs show hematuria and proteinuria. He mentioned at OV that he had been told before that he had some blood in his urine "several times". I do not see that in our chart records. He did have an elevated creatinine at one time. Long standing HTN that has been better controlled in last few years. Will refer to nephrology for concern these are result of primary kidney issue. Could consider urology work up in future pending opinion of nephrology.

## 2023-07-06 ENCOUNTER — Telehealth: Payer: Self-pay

## 2023-07-06 DIAGNOSIS — R7989 Other specified abnormal findings of blood chemistry: Secondary | ICD-10-CM

## 2023-07-06 NOTE — Telephone Encounter (Signed)
Patient calls nurse line regarding referral to [facility].   Patient reports that he had to cancel new patient appointment due to new job, he just tried to reschedule and was told that he would need a new referral.   Please send new referral to [facility].   Patient asks that we let him know once this has been placed so that he can call and schedule appointment.   Veronda Prude, RN

## 2023-07-27 DIAGNOSIS — R319 Hematuria, unspecified: Secondary | ICD-10-CM | POA: Diagnosis not present

## 2023-07-27 DIAGNOSIS — I129 Hypertensive chronic kidney disease with stage 1 through stage 4 chronic kidney disease, or unspecified chronic kidney disease: Secondary | ICD-10-CM | POA: Diagnosis not present

## 2023-07-27 DIAGNOSIS — E785 Hyperlipidemia, unspecified: Secondary | ICD-10-CM | POA: Diagnosis not present

## 2023-08-01 ENCOUNTER — Other Ambulatory Visit: Payer: Self-pay | Admitting: Nephrology

## 2023-08-01 DIAGNOSIS — R319 Hematuria, unspecified: Secondary | ICD-10-CM

## 2023-08-03 ENCOUNTER — Ambulatory Visit
Admission: RE | Admit: 2023-08-03 | Discharge: 2023-08-03 | Disposition: A | Discharge status: Home / Self Care | Payer: BC Managed Care – PPO | Source: Ambulatory Visit | Attending: Nephrology | Admitting: Nephrology

## 2023-08-03 DIAGNOSIS — R319 Hematuria, unspecified: Secondary | ICD-10-CM

## 2023-08-08 ENCOUNTER — Telehealth: Payer: Self-pay | Admitting: Family Medicine

## 2023-08-08 DIAGNOSIS — R319 Hematuria, unspecified: Secondary | ICD-10-CM

## 2023-08-10 NOTE — Telephone Encounter (Signed)
Called patient and informed him of message form Dr. Jennette Kettle.  Patient would like a call from Dr. Jennette Kettle explaining a cyst that was found on the report.  He saw this finding on [facility].  Glennie Hawk, CMA

## 2023-08-10 NOTE — Telephone Encounter (Signed)
Please let him know the [place] of his kidneys was normal except for some mild chronic changes related to hypertension which is common. Nothing to worry about and nothing to do about that except continue good BP control.  I will send a referral to Urology for the hematuria and he should hear from their office in next 7-10 days regarding an appointment. If he does not hear from them by then, let me know.  THANKS! Denny Levy

## 2023-08-11 NOTE — Telephone Encounter (Signed)
I spoke with Paul Henderson via phone.  There is nothing to worry about regarding the cyst.  It is a benign finding and incidental finding.  I had told him I would refer him to urology for the hematuria.  He was thinking that he had to have something done with the cyst but we discussed.  I placed the referral for urology.

## 2023-08-18 DIAGNOSIS — R3121 Asymptomatic microscopic hematuria: Secondary | ICD-10-CM | POA: Diagnosis not present

## 2023-08-28 DIAGNOSIS — R3121 Asymptomatic microscopic hematuria: Secondary | ICD-10-CM | POA: Diagnosis not present

## 2023-09-08 DIAGNOSIS — Q8781 Alport syndrome: Secondary | ICD-10-CM | POA: Diagnosis not present

## 2023-09-08 DIAGNOSIS — I129 Hypertensive chronic kidney disease with stage 1 through stage 4 chronic kidney disease, or unspecified chronic kidney disease: Secondary | ICD-10-CM | POA: Diagnosis not present

## 2023-09-08 DIAGNOSIS — E785 Hyperlipidemia, unspecified: Secondary | ICD-10-CM | POA: Diagnosis not present

## 2023-09-08 DIAGNOSIS — R319 Hematuria, unspecified: Secondary | ICD-10-CM | POA: Diagnosis not present

## 2023-11-12 ENCOUNTER — Other Ambulatory Visit: Payer: Self-pay | Admitting: Family Medicine

## 2023-11-12 DIAGNOSIS — I10 Essential (primary) hypertension: Secondary | ICD-10-CM

## 2023-12-12 ENCOUNTER — Other Ambulatory Visit: Payer: Self-pay | Admitting: Family Medicine

## 2023-12-12 DIAGNOSIS — I10 Essential (primary) hypertension: Secondary | ICD-10-CM

## 2023-12-16 ENCOUNTER — Other Ambulatory Visit: Payer: Self-pay | Admitting: Family Medicine

## 2023-12-16 DIAGNOSIS — I10 Essential (primary) hypertension: Secondary | ICD-10-CM

## 2024-03-01 ENCOUNTER — Other Ambulatory Visit: Payer: Self-pay | Admitting: Family Medicine

## 2024-03-01 ENCOUNTER — Other Ambulatory Visit: Payer: Self-pay

## 2024-03-01 DIAGNOSIS — I10 Essential (primary) hypertension: Secondary | ICD-10-CM

## 2024-03-01 MED ORDER — CANDESARTAN CILEXETIL-HCTZ 32-25 MG PO TABS: 1.0000 | ORAL_TABLET | Freq: Every day | ORAL | 0 refills | Status: DC

## 2024-03-01 MED ORDER — AMLODIPINE BESYLATE 5 MG PO TABS: 5.0000 mg | ORAL_TABLET | Freq: Every day | ORAL | 0 refills | Status: DC

## 2024-03-19 ENCOUNTER — Other Ambulatory Visit: Payer: Self-pay | Admitting: Family Medicine

## 2024-03-19 DIAGNOSIS — I10 Essential (primary) hypertension: Secondary | ICD-10-CM

## 2024-05-29 ENCOUNTER — Encounter: Payer: Self-pay | Admitting: Family Medicine

## 2024-05-29 ENCOUNTER — Ambulatory Visit: Payer: Self-pay | Admitting: Family Medicine

## 2024-05-29 VITALS — BP 120/73 | HR 78 | Ht 70.0 in | Wt 240.2 lb

## 2024-05-29 DIAGNOSIS — I10 Essential (primary) hypertension: Secondary | ICD-10-CM

## 2024-05-29 DIAGNOSIS — E785 Hyperlipidemia, unspecified: Secondary | ICD-10-CM

## 2024-05-29 DIAGNOSIS — E66812 Obesity, class 2: Secondary | ICD-10-CM

## 2024-05-29 DIAGNOSIS — E6609 Other obesity due to excess calories: Secondary | ICD-10-CM

## 2024-05-29 DIAGNOSIS — Z6835 Body mass index (BMI) 35.0-35.9, adult: Secondary | ICD-10-CM

## 2024-05-29 MED ORDER — CANDESARTAN CILEXETIL-HCTZ 32-25 MG PO TABS: 1.0000 | ORAL_TABLET | Freq: Every day | ORAL | 3 refills | Status: AC

## 2024-05-29 MED ORDER — AMLODIPINE BESYLATE 5 MG PO TABS: 5.0000 mg | ORAL_TABLET | Freq: Every day | ORAL | 3 refills | Status: AC

## 2024-05-30 LAB — COMPREHENSIVE METABOLIC PANEL WITH GFR
ALT: 35 IU/L (ref 0–44)
AST: 35 IU/L (ref 0–40)
Albumin: 4.5 g/dL (ref 3.8–4.9)
Alkaline Phosphatase: 79 IU/L (ref 47–123)
BUN/Creatinine Ratio: 13 (ref 9–20)
BUN: 13 mg/dL (ref 6–24)
Bilirubin Total: 1.1 mg/dL (ref 0.0–1.2)
CO2: 25 mmol/L (ref 20–29)
Calcium: 10.3 mg/dL — ABNORMAL HIGH (ref 8.7–10.2)
Chloride: 100 mmol/L (ref 96–106)
Creatinine, Ser: 1.02 mg/dL (ref 0.76–1.27)
Globulin, Total: 3 g/dL (ref 1.5–4.5)
Glucose: 93 mg/dL (ref 70–99)
Potassium: 4.4 mmol/L (ref 3.5–5.2)
Sodium: 139 mmol/L (ref 134–144)
Total Protein: 7.5 g/dL (ref 6.0–8.5)
eGFR: 88 mL/min/1.73 (ref >59)

## 2024-05-31 ENCOUNTER — Ambulatory Visit: Payer: Self-pay | Admitting: Family Medicine

## 2024-05-31 NOTE — Progress Notes (Signed)
    CHIEF COMPLAINT / HPI:   HTN Taking meds appropriately. No problems. No SOB or CP 2. Obesity and Elevated cholesterol: has been working hard on diet and has lost some weight 3. Stressors with new job, working 60 hours per week  PERTINENT  PMH / PSH: I have reviewed the patient's medications, allergies, past medical and surgical history, smoking status and updated in the EMR as appropriate.   OBJECTIVE:  BP 120/73   Pulse 78   Ht 5' 10 (1.778 m)   Wt 240 lb 3.2 oz (109 kg)   SpO2 99%   BMI 34.47 kg/m   Vital signs reviewed. GENERAL: Well-developed, well-nourished, no acute distress. CARDIOVASCULAR: Regular rate and rhythm no murmur gallop or rub LUNGS: Clear to auscultation bilaterally, no rales or wheeze. ABDOMEN: Soft positive bowel sounds NEURO: No gross focal neurological deficits. MSK: Movement of extremity x 4.  ASSESSMENT / PLAN: HTN: check labs Refilled meds Good control 2. Elevated lipids now s/p LSM. Check LDL 3. Obesity: congratulated on improvement in BMI  No problem-specific Assessment & Plan notes found for this encounter.   Camie Mulch MD
